# Patient Record
Sex: Female | Born: 1965
Health system: Southern US, Community
[De-identification: ages and names within clinical notes are randomized; demographics above are authoritative.]

## PROBLEM LIST (undated history)

## (undated) DIAGNOSIS — N6019 Diffuse cystic mastopathy of unspecified breast: Secondary | ICD-10-CM

## (undated) DIAGNOSIS — E785 Hyperlipidemia, unspecified: Secondary | ICD-10-CM

## (undated) DIAGNOSIS — E669 Obesity, unspecified: Secondary | ICD-10-CM

## (undated) DIAGNOSIS — E119 Type 2 diabetes mellitus without complications: Secondary | ICD-10-CM

## (undated) DIAGNOSIS — N63 Unspecified lump in unspecified breast: Secondary | ICD-10-CM

## (undated) DIAGNOSIS — E559 Vitamin D deficiency, unspecified: Secondary | ICD-10-CM

## (undated) DIAGNOSIS — H409 Unspecified glaucoma: Secondary | ICD-10-CM

## (undated) DIAGNOSIS — I10 Essential (primary) hypertension: Secondary | ICD-10-CM

## (undated) HISTORY — DX: Vitamin D deficiency, unspecified: E55.9

## (undated) HISTORY — PX: BREAST LUMPECTOMY: SHX2

## (undated) HISTORY — DX: Type 2 diabetes mellitus without complications: E11.9

## (undated) HISTORY — DX: Diffuse cystic mastopathy of unspecified breast: N60.19

## (undated) HISTORY — DX: Hyperlipidemia, unspecified: E78.5

## (undated) HISTORY — DX: Unspecified glaucoma: H40.9

## (undated) HISTORY — DX: Essential (primary) hypertension: I10

## (undated) HISTORY — DX: Unspecified lump in unspecified breast: N63.0

## (undated) HISTORY — PX: TUBAL LIGATION: SHX77

## (undated) HISTORY — DX: Obesity, unspecified: E66.9

---

## 1998-09-07 ENCOUNTER — Other Ambulatory Visit: Admission: RE | Admit: 1998-09-07 | Discharge: 1998-09-07 | Payer: Self-pay | Admitting: Obstetrics and Gynecology

## 1999-10-31 ENCOUNTER — Other Ambulatory Visit: Admission: RE | Admit: 1999-10-31 | Discharge: 1999-10-31 | Payer: Self-pay | Admitting: Obstetrics and Gynecology

## 2000-12-31 ENCOUNTER — Other Ambulatory Visit: Admission: RE | Admit: 2000-12-31 | Discharge: 2000-12-31 | Payer: Self-pay | Admitting: Obstetrics and Gynecology

## 2002-01-25 ENCOUNTER — Other Ambulatory Visit: Admission: RE | Admit: 2002-01-25 | Discharge: 2002-01-25 | Payer: Self-pay | Admitting: Obstetrics and Gynecology

## 2003-01-26 ENCOUNTER — Other Ambulatory Visit: Admission: RE | Admit: 2003-01-26 | Discharge: 2003-01-26 | Payer: Self-pay | Admitting: Obstetrics and Gynecology

## 2003-01-31 ENCOUNTER — Encounter: Payer: Self-pay | Admitting: Obstetrics and Gynecology

## 2003-01-31 ENCOUNTER — Encounter: Admission: RE | Admit: 2003-01-31 | Discharge: 2003-01-31 | Payer: Self-pay | Admitting: Obstetrics and Gynecology

## 2003-04-01 ENCOUNTER — Encounter (HOSPITAL_BASED_OUTPATIENT_CLINIC_OR_DEPARTMENT_OTHER): Payer: Self-pay | Admitting: General Surgery

## 2003-04-06 ENCOUNTER — Ambulatory Visit (HOSPITAL_COMMUNITY): Admission: RE | Admit: 2003-04-06 | Discharge: 2003-04-06 | Payer: Self-pay | Admitting: General Surgery

## 2003-04-06 ENCOUNTER — Encounter (INDEPENDENT_AMBULATORY_CARE_PROVIDER_SITE_OTHER): Payer: Self-pay | Admitting: *Deleted

## 2004-02-14 ENCOUNTER — Other Ambulatory Visit: Admission: RE | Admit: 2004-02-14 | Discharge: 2004-02-14 | Payer: Self-pay | Admitting: Obstetrics and Gynecology

## 2005-06-05 ENCOUNTER — Other Ambulatory Visit: Admission: RE | Admit: 2005-06-05 | Discharge: 2005-06-05 | Payer: Self-pay | Admitting: Obstetrics and Gynecology

## 2005-07-30 ENCOUNTER — Encounter: Admission: RE | Admit: 2005-07-30 | Discharge: 2005-07-30 | Payer: Self-pay | Admitting: General Surgery

## 2005-09-11 ENCOUNTER — Ambulatory Visit (HOSPITAL_COMMUNITY): Admission: RE | Admit: 2005-09-11 | Discharge: 2005-09-11 | Payer: Self-pay | Admitting: General Surgery

## 2005-09-11 ENCOUNTER — Ambulatory Visit (HOSPITAL_BASED_OUTPATIENT_CLINIC_OR_DEPARTMENT_OTHER): Admission: RE | Admit: 2005-09-11 | Discharge: 2005-09-11 | Payer: Self-pay | Admitting: General Surgery

## 2005-09-12 ENCOUNTER — Encounter (INDEPENDENT_AMBULATORY_CARE_PROVIDER_SITE_OTHER): Payer: Self-pay | Admitting: *Deleted

## 2006-06-10 ENCOUNTER — Other Ambulatory Visit: Admission: RE | Admit: 2006-06-10 | Discharge: 2006-06-10 | Payer: Self-pay | Admitting: Obstetrics and Gynecology

## 2006-11-05 ENCOUNTER — Encounter: Admission: RE | Admit: 2006-11-05 | Discharge: 2006-11-05 | Payer: Self-pay | Admitting: General Surgery

## 2007-04-14 ENCOUNTER — Ambulatory Visit: Payer: Self-pay | Admitting: Family Medicine

## 2007-04-14 LAB — CONVERTED CEMR LAB
Basophils Relative: 0 % (ref 0.0–1.0)
Direct LDL: 162.8 mg/dL
Eosinophils Relative: 1.6 % (ref 0.0–5.0)
HCT: 35.7 % — ABNORMAL LOW (ref 36.0–46.0)
Hemoglobin: 12 g/dL (ref 12.0–15.0)
Lymphocytes Relative: 33.8 % (ref 12.0–46.0)
MCV: 83.4 fL (ref 78.0–100.0)
Monocytes Relative: 7.7 % (ref 3.0–11.0)
RBC: 4.28 M/uL (ref 3.87–5.11)
TSH: 0.92 microintl units/mL (ref 0.35–5.50)
VLDL: 22 mg/dL (ref 0–40)

## 2007-05-14 ENCOUNTER — Ambulatory Visit: Payer: Self-pay | Admitting: Family Medicine

## 2009-04-11 ENCOUNTER — Encounter: Payer: Self-pay | Admitting: Family Medicine

## 2009-04-20 ENCOUNTER — Ambulatory Visit: Payer: Self-pay | Admitting: Family Medicine

## 2009-04-20 DIAGNOSIS — G4730 Sleep apnea, unspecified: Secondary | ICD-10-CM | POA: Insufficient documentation

## 2009-04-20 DIAGNOSIS — K219 Gastro-esophageal reflux disease without esophagitis: Secondary | ICD-10-CM

## 2009-04-20 DIAGNOSIS — J309 Allergic rhinitis, unspecified: Secondary | ICD-10-CM | POA: Insufficient documentation

## 2009-04-20 DIAGNOSIS — M25569 Pain in unspecified knee: Secondary | ICD-10-CM

## 2009-04-24 ENCOUNTER — Telehealth (INDEPENDENT_AMBULATORY_CARE_PROVIDER_SITE_OTHER): Payer: Self-pay | Admitting: *Deleted

## 2009-04-24 LAB — CONVERTED CEMR LAB
ALT: 15 units/L (ref 0–35)
AST: 18 units/L (ref 0–37)
Alkaline Phosphatase: 76 units/L (ref 39–117)
Bilirubin, Direct: 0 mg/dL (ref 0.0–0.3)
CO2: 29 meq/L (ref 19–32)
Chloride: 104 meq/L (ref 96–112)
Cholesterol: 225 mg/dL — ABNORMAL HIGH (ref 0–200)
Eosinophils Absolute: 0.1 10*3/uL (ref 0.0–0.7)
Eosinophils Relative: 1.2 % (ref 0.0–5.0)
GFR calc non Af Amer: 87.35 mL/min (ref >60)
Hemoglobin: 13 g/dL (ref 12.0–15.0)
Lymphocytes Relative: 40.8 % (ref 12.0–46.0)
MCV: 84.7 fL (ref 78.0–100.0)
Monocytes Absolute: 0.3 10*3/uL (ref 0.1–1.0)
Monocytes Relative: 6.7 % (ref 3.0–12.0)
Neutro Abs: 2.5 10*3/uL (ref 1.4–7.7)
Platelets: 213 10*3/uL (ref 150.0–400.0)
RBC: 4.49 M/uL (ref 3.87–5.11)
TSH: 0.9 microintl units/mL (ref 0.35–5.50)
Triglycerides: 106 mg/dL (ref 0.0–149.0)
WBC: 5.1 10*3/uL (ref 4.5–10.5)

## 2009-04-27 ENCOUNTER — Ambulatory Visit: Payer: Self-pay | Admitting: Sports Medicine

## 2009-04-27 DIAGNOSIS — M2140 Flat foot [pes planus] (acquired), unspecified foot: Secondary | ICD-10-CM | POA: Insufficient documentation

## 2009-04-27 DIAGNOSIS — M2170 Unequal limb length (acquired), unspecified site: Secondary | ICD-10-CM

## 2009-04-28 ENCOUNTER — Encounter: Payer: Self-pay | Admitting: Sports Medicine

## 2009-05-04 ENCOUNTER — Ambulatory Visit: Payer: Self-pay | Admitting: Pulmonary Disease

## 2009-05-04 DIAGNOSIS — G4733 Obstructive sleep apnea (adult) (pediatric): Secondary | ICD-10-CM

## 2009-05-31 ENCOUNTER — Ambulatory Visit: Payer: Self-pay | Admitting: Family Medicine

## 2009-05-31 DIAGNOSIS — E785 Hyperlipidemia, unspecified: Secondary | ICD-10-CM | POA: Insufficient documentation

## 2009-06-14 ENCOUNTER — Ambulatory Visit (HOSPITAL_BASED_OUTPATIENT_CLINIC_OR_DEPARTMENT_OTHER): Admission: RE | Admit: 2009-06-14 | Discharge: 2009-06-14 | Payer: Self-pay | Admitting: Pulmonary Disease

## 2009-06-14 ENCOUNTER — Encounter: Payer: Self-pay | Admitting: Pulmonary Disease

## 2009-06-18 ENCOUNTER — Ambulatory Visit: Payer: Self-pay | Admitting: Pulmonary Disease

## 2009-06-20 ENCOUNTER — Telehealth: Payer: Self-pay | Admitting: Pulmonary Disease

## 2009-07-10 ENCOUNTER — Ambulatory Visit: Payer: Self-pay | Admitting: Pulmonary Disease

## 2010-06-28 ENCOUNTER — Encounter: Payer: Self-pay | Admitting: Family Medicine

## 2010-06-29 ENCOUNTER — Encounter: Payer: Self-pay | Admitting: Family Medicine

## 2010-06-29 LAB — CONVERTED CEMR LAB
BUN: 11 mg/dL
CO2, serum: 26 mmol/L
Calcium: 9.6 mg/dL
Chloride, Serum: 103 mmol/L
Glucose, Bld: 103 mg/dL
Hemoglobin: 12.6 g/dL
LDL Cholesterol: 165 mg/dL
MCV: 87.2 fL
Potassium, serum: 4.5 mmol/L
RBC count: 4.46 10*6/uL
Sodium, serum: 140 mmol/L
TSH: 1.205 microintl units/mL
Total Protein: 8 g/dL
Triglycerides: 72 mg/dL

## 2010-07-18 ENCOUNTER — Encounter: Payer: Self-pay | Admitting: Family Medicine

## 2010-07-18 ENCOUNTER — Encounter: Admission: RE | Admit: 2010-07-18 | Discharge: 2010-07-18 | Payer: Self-pay | Admitting: Obstetrics and Gynecology

## 2010-07-18 IMAGING — MG MM DIGITAL SCREENING
4 series · 4 of 4 positions shown · non-contrast
Comparison: none

DG SCREEN MAMMOGRAM BILATERAL
Bilateral CC and MLO view(s) were taken.

DIGITAL SCREENING MAMMOGRAM WITH CAD:
The breast tissue is extremely dense.  No masses or malignant type calcifications are identified.  
Compared with prior studies.
Images were processed with CAD.

[R CC]
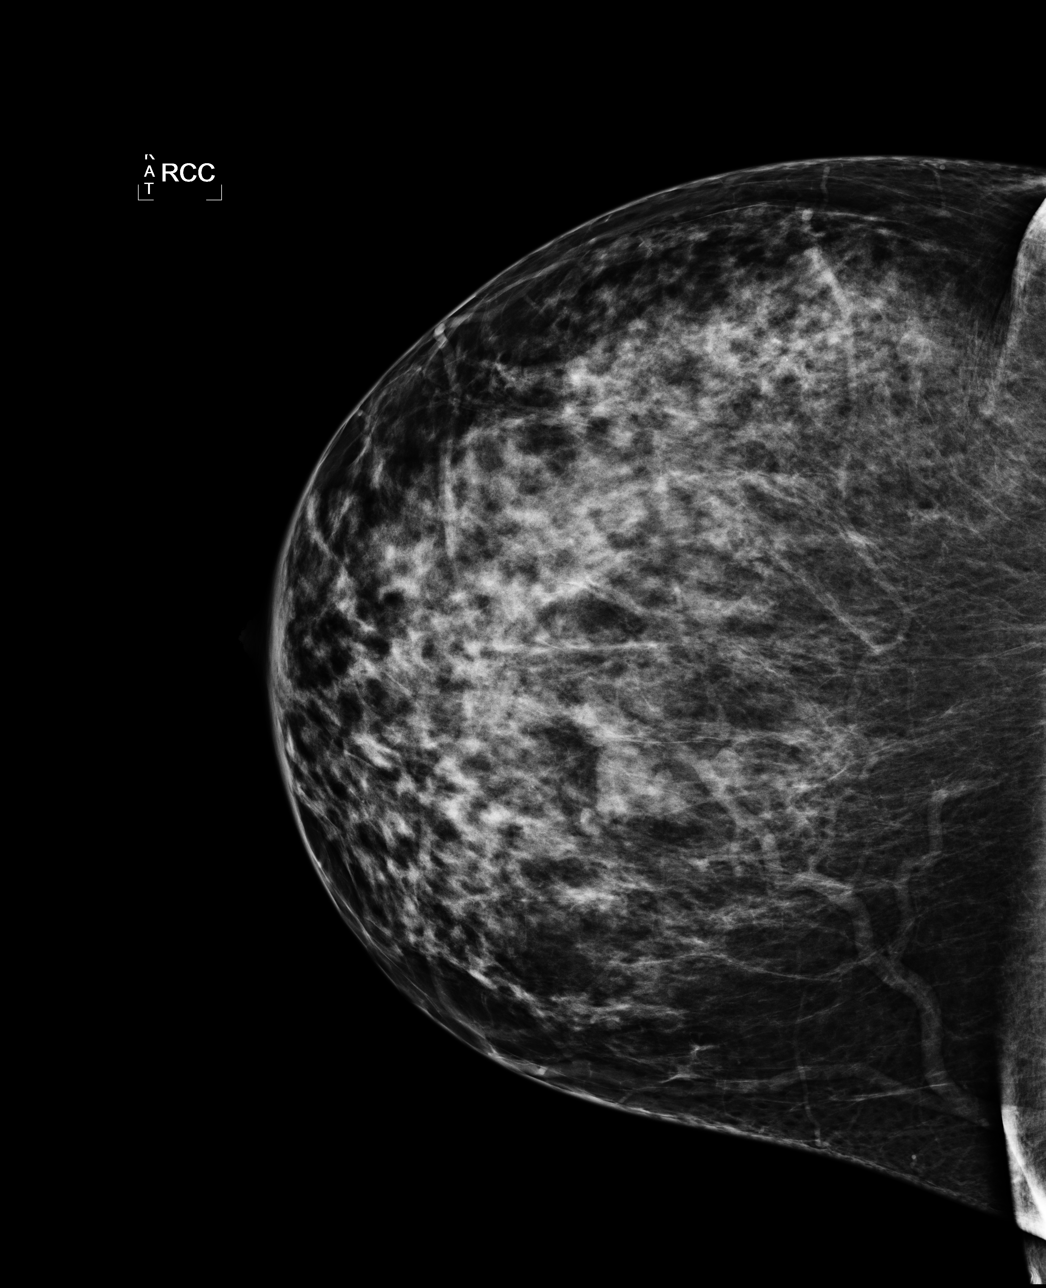

[L CC]
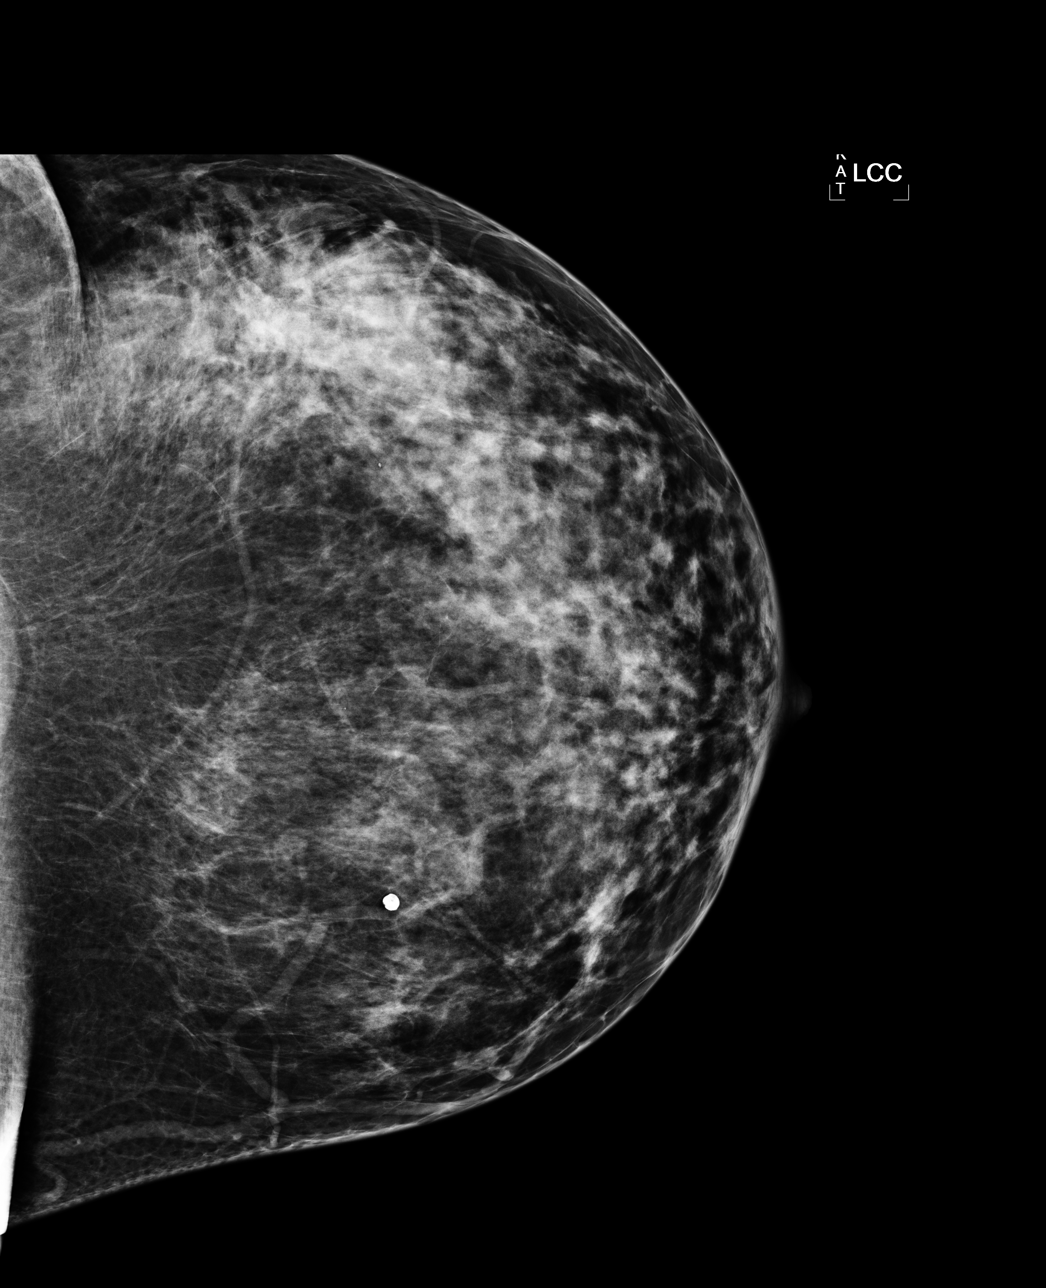

[L MLO]
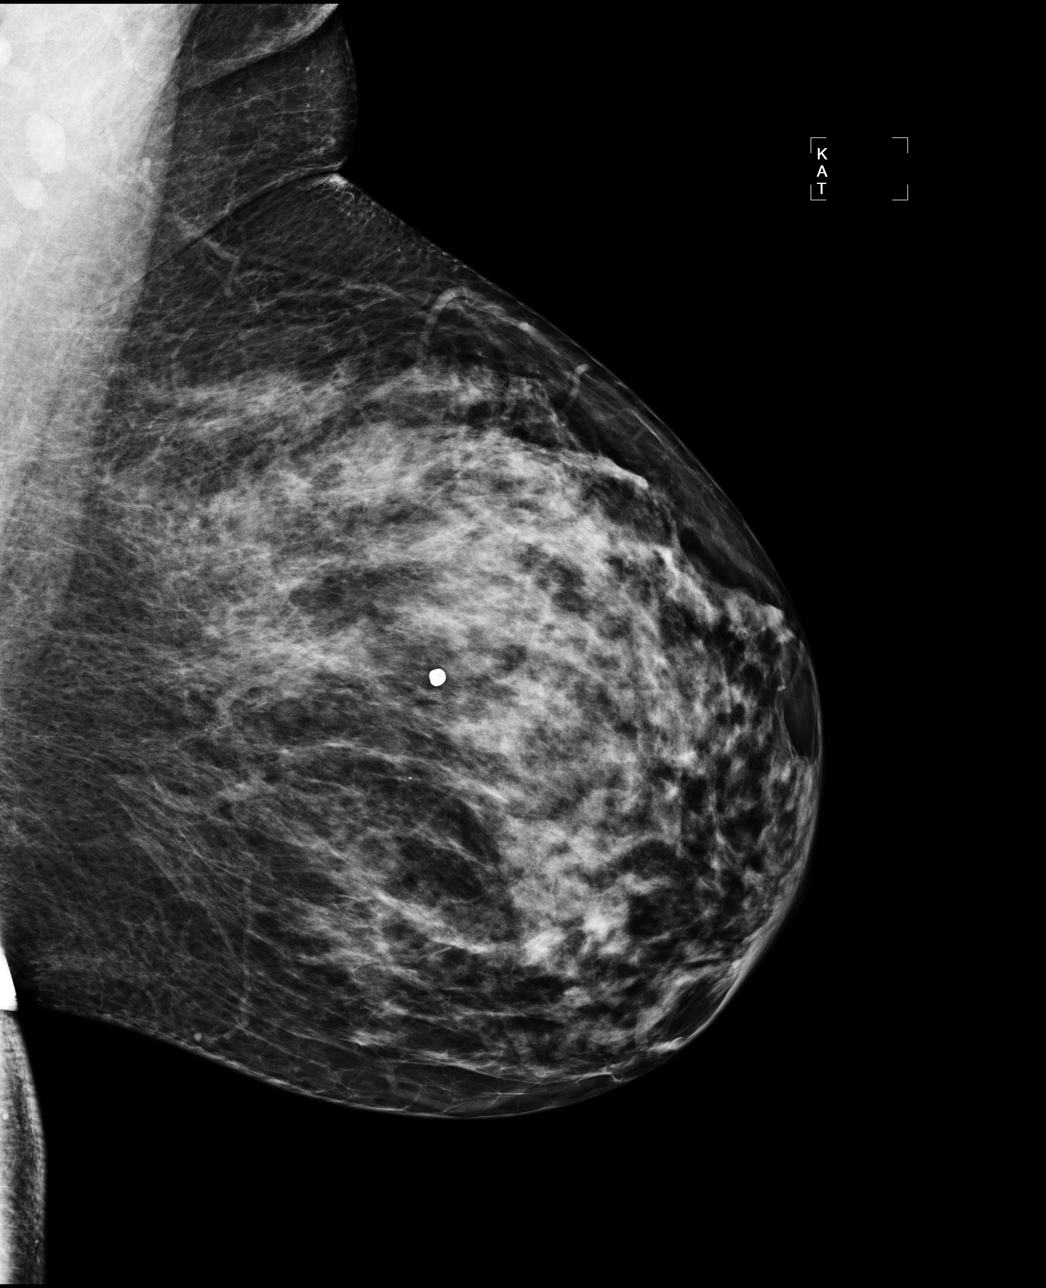

[R MLO]
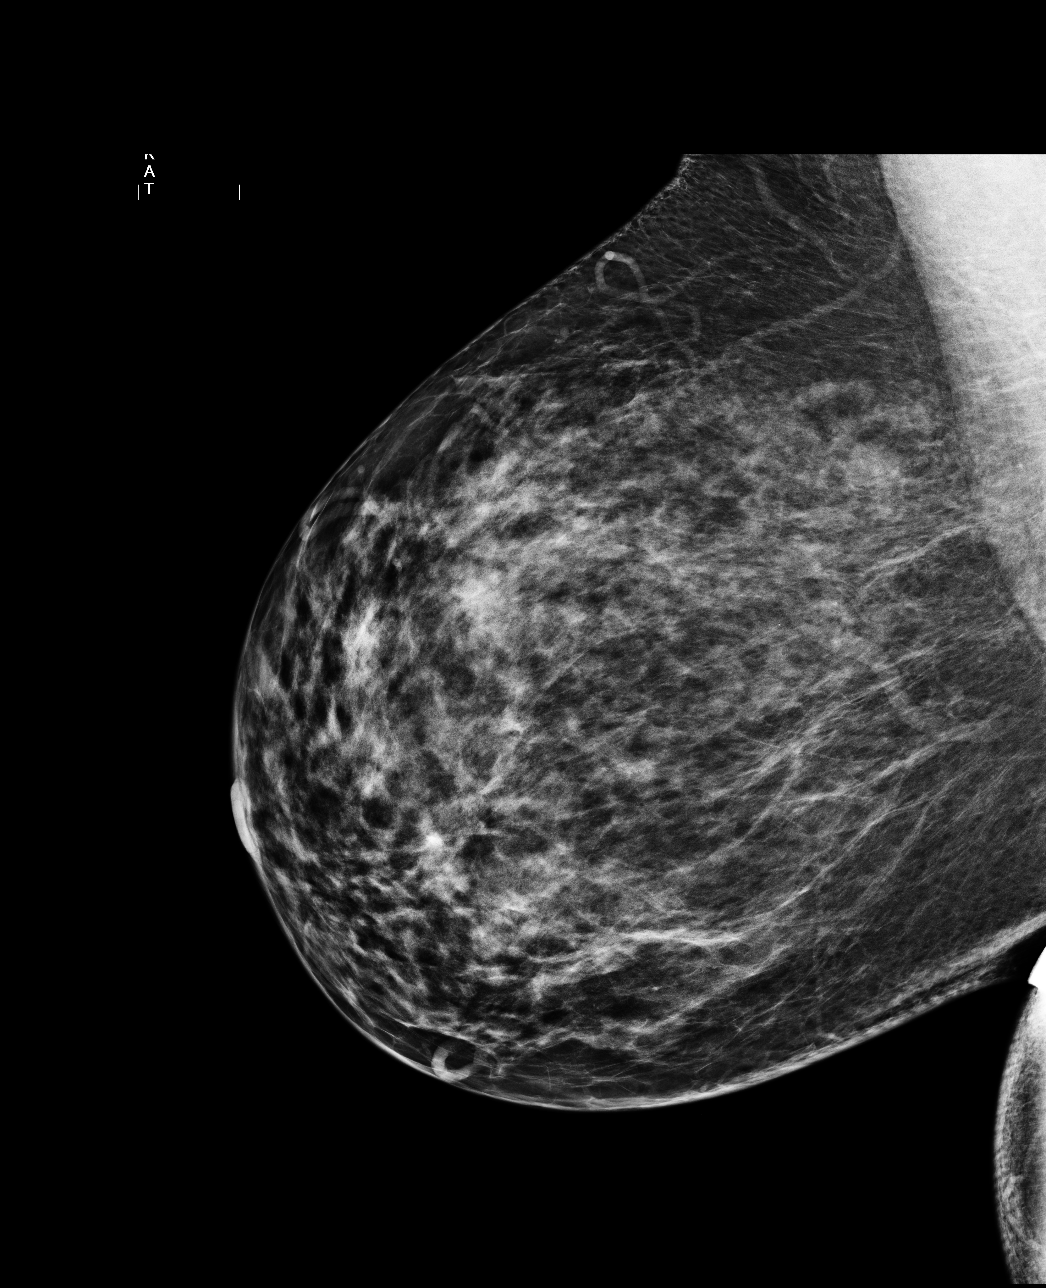

[4 of 4 positions shown; findings below may reference images not displayed]

IMPRESSION: No specific mammographic evidence of malignancy.  Next screening mammogram is recommended in one 
year.

A result letter of this screening mammogram will be mailed directly to the patient.

ASSESSMENT: Negative - BI-RADS 1

Screening mammogram in 1 year.
,

## 2010-07-27 ENCOUNTER — Ambulatory Visit: Payer: Self-pay | Admitting: Family Medicine

## 2010-08-02 ENCOUNTER — Encounter: Payer: Self-pay | Admitting: Family Medicine

## 2010-08-02 ENCOUNTER — Telehealth (INDEPENDENT_AMBULATORY_CARE_PROVIDER_SITE_OTHER): Payer: Self-pay | Admitting: *Deleted

## 2010-08-31 ENCOUNTER — Telehealth: Payer: Self-pay | Admitting: Family Medicine

## 2010-09-01 ENCOUNTER — Ambulatory Visit: Payer: Self-pay | Admitting: Family Medicine

## 2010-09-01 DIAGNOSIS — L255 Unspecified contact dermatitis due to plants, except food: Secondary | ICD-10-CM | POA: Insufficient documentation

## 2010-09-26 ENCOUNTER — Telehealth (INDEPENDENT_AMBULATORY_CARE_PROVIDER_SITE_OTHER): Payer: Self-pay | Admitting: *Deleted

## 2010-10-04 ENCOUNTER — Ambulatory Visit: Payer: Self-pay | Admitting: Family Medicine

## 2010-10-04 LAB — CONVERTED CEMR LAB
Direct LDL: 156.7 mg/dL
Total CHOL/HDL Ratio: 4
Triglycerides: 81 mg/dL (ref 0.0–149.0)

## 2010-10-05 LAB — CONVERTED CEMR LAB
ALT: 13 units/L (ref 0–35)
Albumin: 3.8 g/dL (ref 3.5–5.2)
Bilirubin, Direct: 0 mg/dL (ref 0.0–0.3)
Total Bilirubin: 0.4 mg/dL (ref 0.3–1.2)

## 2011-01-17 NOTE — Assessment & Plan Note (Signed)
Summary: cpx/fasting//kn--Rm 16   Vital Signs:  Patient profile:   45 year old female LMP:     07/03/2010 Height:      65.5 inches Weight:      215.04 pounds BMI:     35.37 Temp:     98.1 degrees F oral Pulse rate:   84 / minute Pulse rhythm:   regular Resp:     16 per minute BP sitting:   130 / 88  (left arm) Cuff size:   large  Vitals Entered By: Mervin Kung CMA Duncan Dull) (July 27, 2010 9:58 AM) CC: Room 16    Pt fasting for physical. Is Patient Diabetic? No LMP (date): 07/03/2010     Enter LMP: 07/03/2010 Last PAP Result normal    History of Present Illness: 45 yo woman here today for CPE.  GYN- Stringer. had labs done.  Started on Vit D by GYN.  no concerns about heatlh.  Preventive Screening-Counseling & Management  Alcohol-Tobacco     Alcohol drinks/day: <1     Alcohol type: wine     Smoking Status: never  Caffeine-Diet-Exercise     Caffeine use/day: tea  30-30 oz daily     Does Patient Exercise: no      Sexual History:  currently monogamous.        Drug Use:  never.    Current Medications (verified): 1)  Vitamin D 1500 Iu .... Take 1 Tablet Two Times Weekly For 8 Weeks.  Allergies (verified): No Known Drug Allergies  Past History:  reviewed meds, problems, past hx, etc in pt's other chart- no changes required  Past Surgical History: Fibroid cyst removal, left breast--2004, 2006 Tubal Ligation--1994  Family History: Mother-- living ?HTN Father-- deceased, MI 1 son--healthy 1 daughter--healthy  Social History: Occupation: Sports coach Married Never Smoked Alcohol use-yes Regular exercise-no Smoking Status:  never Caffeine use/day:  tea  30-30 oz daily Does Patient Exercise:  no Occupation:  employed Sexual History:  currently monogamous Drug Use:  never  Review of Systems  The patient denies anorexia, fever, weight loss, weight gain, vision loss, decreased hearing, hoarseness, chest pain, syncope, dyspnea on exertion,  peripheral edema, prolonged cough, headaches, abdominal pain, melena, hematochezia, severe indigestion/heartburn, hematuria, suspicious skin lesions, depression, abnormal bleeding, enlarged lymph nodes, and breast masses.    Physical Exam  General:  Well-developed,well-nourished,in no acute distress; alert,appropriate and cooperative throughout examination.  overwt Head:  Normocephalic and atraumatic without obvious abnormalities. No apparent alopecia or balding. Eyes:  No corneal or conjunctival inflammation noted. EOMI. Perrla. Funduscopic exam benign, without hemorrhages, exudates or papilledema. Vision grossly normal. Ears:  External ear exam shows no significant lesions or deformities.  Otoscopic examination reveals clear canals, tympanic membranes are intact bilaterally without bulging, retraction, inflammation or discharge. Hearing is grossly normal bilaterally. Nose:  External nasal examination shows no deformity or inflammation. Nasal mucosa are pink and moist without lesions or exudates. Mouth:  Oral mucosa and oropharynx without lesions or exudates.  Teeth in good repair. Neck:  No deformities, masses, or tenderness noted. Breasts:  deferred to gyn Lungs:  Normal respiratory effort, chest expands symmetrically. Lungs are clear to auscultation, no crackles or wheezes. Heart:  Normal rate and regular rhythm. S1 and S2 normal without gallop, murmur, click, rub or other extra sounds. Abdomen:  Bowel sounds positive,abdomen soft and non-tender without masses, organomegaly or hernias noted. Genitalia:  deferred to gyn Msk:  No deformity or scoliosis noted of thoracic or lumbar spine.   Pulses:  +  2 carotid, radial, DP Extremities:  No clubbing, cyanosis, edema, or deformity noted with normal full range of motion of all joints.   Neurologic:  No cranial nerve deficits noted. Station and gait are normal. Plantar reflexes are down-going bilaterally. DTRs are symmetrical throughout. Sensory, motor  and coordinative functions appear intact. Skin:  Intact without suspicious lesions or rashes Cervical Nodes:  No lymphadenopathy noted Axillary Nodes:  No palpable lymphadenopathy Psych:  Cognition and judgment appear intact. Alert and cooperative with normal attention span and concentration. No apparent delusions, illusions, hallucinations   Impression & Recommendations:  Problem # 1:  PHYSICAL EXAMINATION (ICD-V70.0) pt's PE WNL.  will review labs from GYN rather than repeat them.  anticipatory guidance provided.  Complete Medication List: 1)  Vitamin D 1500 Iu  .... Take 1 tablet two times weekly for 8 weeks.  Patient Instructions: 1)  Your exam looks great! 2)  Please have your GYN send me copies of your labs- if there's something missing we can always add it 3)  Try and get regular exercise and continue to make your healthy food choices 4)  Call with any questions or concerns 5)  Good luck with the new school year!   Preventive Care Screening  Pap Smear:    Date:  06/15/2009    Results:  normal      Just had mammogram 07/18/10--doesn't know result yet.   Current Allergies (reviewed today): No known allergies

## 2011-01-17 NOTE — Progress Notes (Signed)
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 Pt begin to develop small bumps on the area where plant touch her. Pt now c/o  area welting up/swelling, redness, and  some itching on area.  Pt denies any fever, pain, tenderness or sob. Pt has tried benadryl and alcohol on area with no relief. Pt has not tried any topical creams on area. Pt  schedule for saturday clinic but advise if she start to experience increased redness,swelling, difficulty breathing or fever she needs to be see in ED/UC prior to appt. Pt ok,and verbalized understanding .. pls advise on any suggestion for treatment today...................Marland KitchenFelecia Deloach CMA  August 31, 2010 4:34 PM    Follow-up for Phone Call        pt should use hydrocortisone cream on the areas.  if lip/tongue/face swelling or SOB needs to go to UC or ER overnight.  otherwise will be treated appropriately tomorrow at Saturday clinic Follow-up by: Neena Rhymes MD,  August 31, 2010 4:42 PM  Additional Follow-up for Phone Call Additional follow up Details #1::        discuss with patient, pt verbalized understanding.................Marland KitchenFelecia Deloach CMA  August 31, 2010 4:49 PM

## 2011-01-17 NOTE — Assessment & Plan Note (Signed)
Summary: rhus dermatitis   Vital Signs:  Patient profile:   45 year old female Height:      65.5 inches Weight:      216 pounds BMI:     35.53 Temp:     98.3 degrees F oral Pulse rate:   88 / minute Pulse rhythm:   regular Resp:     16 per minute BP sitting:   118 / 86  (right arm) Cuff size:   large  Vitals Entered By: Lanier Prude, CMA(AAMA) (September 01, 2010 9:33 AM) CC: itchy bumps on both arms from brushing against a plant 6 days ago Is Patient Diabetic? No   Primary Care Provider:  Beverely Low  CC:  itchy bumps on both arms from brushing against a plant 6 days ago.  History of Present Illness: 45 yo AAF presents for an itchy rash on both arms x 3 days after she was at her mom's house and brushed up against a plant on Monday.  She has had blistering on both arms with redness and pruritis.  Denies any spread.  Denies sore throat.  Denies any F/C.  She tried some topical mouthwash but it didn't help.  She also used Epsom salt.  She also tried alcholol which has helped dry it up some.  She has been taking Benadyl for itching.  She also used [employer].    Habits & Providers  Alcohol-Tobacco-Diet     Alcohol drinks/day: <1     Alcohol type: wine     Tobacco Status: never  Current Medications (verified): 1)  Fluticasone Propionate 50 Mcg/act  Susp (Fluticasone Propionate) .... 2 Sprays Each Nostril Once Daily As Needed 2)  Omeprazole 20 Mg Cpdr (Omeprazole) .... One By Mouth Daily 3)  Simvastatin 40 Mg Tabs (Simvastatin) .... Take One Tablet At Bedtime 4)  Vitamin D 1500 Iu .... Take 1 Tablet Two Times Weekly For 8 Weeks.  Allergies (verified): No Known Drug Allergies  Review of Systems      See HPI  Physical Exam  General:  alert, well-developed, well-nourished, and well-hydrated.   Eyes:  conjunctiva clear Mouth:  pharynx pink and moist.   Neck:  no masses.   Lungs:  Normal respiratory effort, chest expands symmetrically. Lungs are clear to auscultation, no  crackles or wheezes. Heart:  Normal rate and regular rhythm. S1 and S2 normal without gallop, murmur, click, rub or other extra sounds. Skin:  erythema and excoriations surrounding tiny linear vesicles on both arms.     Impression & Recommendations:  Problem # 1:  RHUS DERMATITIS (ICD-692.6) Mild.  Treat with topical steroid ointment, with benadryl at night. Call if not improved in the next 7 days.  Avoid scratching.  Clean only with soap and water. Her updated medication list for this problem includes:    Triamcinolone Acetonide 0.1 % Oint (Triamcinolone acetonide) .Marland Kitchen... Apply to rash three times a day x 7 days  Complete Medication List: 1)  Fluticasone Propionate 50 Mcg/act Susp (Fluticasone propionate) .... 2 sprays each nostril once daily as needed 2)  Omeprazole 20 Mg Cpdr (Omeprazole) .... One by mouth daily 3)  Simvastatin 40 Mg Tabs (Simvastatin) .... Take one tablet at bedtime 4)  Vitamin D 1500 Iu  .... Take 1 tablet two times weekly for 8 weeks. 5)  Triamcinolone Acetonide 0.1 % Oint (Triamcinolone acetonide) .... Apply to rash three times a day x 7 days  Patient Instructions: 1)  Use topical steroid ointment 3 x a day x 7  days.   2)  Rash should resolve by the end of treatment. 3)  Avoid scratching. 4)  use Benadyl at night. Prescriptions: TRIAMCINOLONE ACETONIDE 0.1 % OINT (TRIAMCINOLONE ACETONIDE) apply to rash three times a day x 7 days  #15 g x 0   Entered and Authorized by:   Seymour Bars DO   Signed by:   Seymour Bars DO on 09/01/2010   Method used:   Electronically to        [facility] [phone]* (retail)       [address]       [place], [place]  [address]       Ph: [phone]       Fax: [phone]   RxID:   [id number]

## 2011-01-17 NOTE — Miscellaneous (Signed)
Summary: labs from Dr.Stringer  Clinical Lists Changes  Observations: Added new observation of MAMMOGRAM: normal (07/18/2010 8:18) Added new observation of VIT D 25-OH: 13 ng/mL (06/29/2010 8:18) Added new observation of TSH: 1.205 microintl units/mL (06/29/2010 8:18) Added new observation of TRIGLYC TOT: 72 mg/dL (16/09/9603 5:40) Added new observation of LDL: 165 mg/dL (98/10/9146 8:29) Added new observation of HDL: 53 mg/dL (56/21/3086 5:78) Added new observation of CHOLESTEROL: 232 mg/dL (46/96/2952 8:41) Added new observation of BILI TOTAL: 0.3 mg/dL (32/44/0102 7:25) Added new observation of ALK PHOS: 71 units/L (06/29/2010 8:18) Added new observation of SGPT (ALT): 11 units/L (06/29/2010 8:18) Added new observation of SGOT (AST): 13 units/L (06/29/2010 8:18) Added new observation of PROTEIN, TOT: 8.0 g/dL (36/64/4034 7:42) Added new observation of ALBUMIN: 4.2 g/dL (59/56/3875 6:43) Added new observation of CALCIUM: 9.6 mg/dL (32/95/1884 1:66) Added new observation of GLUCOSE SER: 103 mg/dL (06/14/1600 0:93) Added new observation of CREATININE: 1.06 mg/dL (23/55/7322 0:25) Added new observation of BUN: 11 mg/dL (42/70/6237 6:28) Added new observation of CO2 TOTAL: 26 mmol/L (06/29/2010 8:18) Added new observation of CHLORIDE: 103 mmol/L (06/29/2010 8:18) Added new observation of POTASSIUM: 4.5 mmol/L (06/29/2010 8:18) Added new observation of SODIUM: 140 mmol/L (06/29/2010 8:18) Added new observation of PLATELETS: 219 10*3/mm3 (06/29/2010 8:18) Added new observation of MCH: 28.3 pg (06/29/2010 8:18) Added new observation of MCV: 87.2 fL (06/29/2010 8:18) Added new observation of HCT: 38.9 % (06/29/2010 8:18) Added new observation of HGB: 12.6 g/dL (31/51/7616 0:73) Added new observation of RBC: 4.46 10*6/mm3 (06/29/2010 8:18) Added new observation of WBC: 6.4 10*3/mm3 (06/29/2010 8:18)      Preventive Care Screening  Mammogram:    Date:  07/18/2010    Results:   normal  Appended Document: labs from Dr.Stringer LDL and total cholesterol are both elevated.  should start Simvastatin 40mg  at bedtime and recheck LFTs in 6-8 weeks.  needs to pay special attention to diet and exercise.  remainder of labs look good.  Appended Document: labs from Dr.Stringer see phone note

## 2011-01-17 NOTE — Progress Notes (Signed)
Summary: labs-  Phone Note Outgoing Call   Call placed by: Doristine Devoid CMA,  August 02, 2010 10:28 AM Call placed to: Patient Summary of Call: LDL and total cholesterol are both elevated.  should start Simvastatin 40mg  at bedtime and recheck LFTs in 6-8 weeks.  needs to pay special attention to diet and exercise.  remainder of labs look good  Follow-up for Phone Call        left message on machine .........Marland KitchenDoristine Devoid CMA  August 02, 2010 10:28 AM   spoke w/ patient aware of labs and says she has some of the medication at home that she hasn't taken and will start medication informed labs to be done in 6-8 weeks.........Marland KitchenDoristine Devoid CMA  August 07, 2010 2:01 PM       Appended Document: labs-    Clinical Lists Changes  Medications: Rx of SIMVASTATIN 40 MG TABS (SIMVASTATIN) take one tablet at bedtime;  #30 x 3;  Signed;  Entered by: Doristine Devoid CMA;  Authorized by: Neena Rhymes MD;  Method used: [facility] [phone]*, [address], [place], [place], [place]  [address], Ph: [phone], Fax: [phone]    Prescriptions: SIMVASTATIN 40 MG TABS (SIMVASTATIN) take one tablet at bedtime  #30 x 3   Entered by:   Doristine Devoid CMA   Authorized by:   Neena Rhymes MD   Signed by:   Doristine Devoid CMA on 08/07/2010   Method used:   Electronically to        [facility] [phone]* (retail)       [address]       [place], [place]  [address]       Ph: [phone]       Fax: [phone]   RxID:   [id number]

## 2011-01-17 NOTE — Progress Notes (Signed)
Summary: LABS ORDERS  Phone Note Call from Patient   Summary of Call: PT CALL WANTING TO GET ALL LABS THAT NEEDS TO BE DONE ON HER. BESIDES HER LFT . NEED ORDERS TO PUT ON APPT FOR 10-20 AT 8 AM. Initial call taken by: Freddy Jaksch,  September 26, 2010 10:01 AM  Follow-up for Phone Call        left message on machine ........Marland KitchenDoristine Devoid CMA  September 26, 2010 4:41 PM   spoke w/ patient informed no additional labs needed at time of appt other than vitamin d.......Marland KitchenDoristine Devoid CMA  September 26, 2010 4:45 PM

## 2011-04-02 ENCOUNTER — Ambulatory Visit (INDEPENDENT_AMBULATORY_CARE_PROVIDER_SITE_OTHER): Payer: BC Managed Care – PPO | Admitting: Family

## 2011-04-02 ENCOUNTER — Encounter: Payer: Self-pay | Admitting: Family

## 2011-04-02 VITALS — BP 130/88 | HR 96 | Temp 99.4°F | Resp 18 | Wt 219.1 lb

## 2011-04-02 DIAGNOSIS — H6690 Otitis media, unspecified, unspecified ear: Secondary | ICD-10-CM

## 2011-04-02 DIAGNOSIS — H6691 Otitis media, unspecified, right ear: Secondary | ICD-10-CM | POA: Insufficient documentation

## 2011-04-02 MED ORDER — AMOXICILLIN 875 MG PO TABS: 875.0000 mg | ORAL_TABLET | Freq: Two times a day (BID) | ORAL | Status: AC

## 2011-04-02 NOTE — Progress Notes (Signed)
  Subjective:    Patient ID: Nancy Spears, female    DOB: [birth year], 45 y.o.   MRN: [id number]  HPI  Sinus drainage- stared Sunday,  Throat has been scratchy.  Feels hoarse.  Clear drainage.  Denies fever.  +cough but it is dry and associated with drainage.  She took claritin yesterday with mild improvement in her symptoms. She nots some frontal sinus pressure.    Review of Systems See HPI  No past medical history on file.  History   Social History  . Marital Status: Married    Spouse Name: N/A    Number of Children: N/A  . Years of Education: N/A   Occupational History  . Not on file.   Social History Main Topics  . Smoking status: Never Smoker   . Smokeless tobacco: Never Used  . Alcohol Use: Not on file  . Drug Use: Not on file  . Sexually Active: Not on file   Other Topics Concern  . Not on file   Social History Narrative  . No narrative on file    No past surgical history on file.  No family history on file.  No Known Allergies  No current outpatient prescriptions on file prior to visit.    BP 130/88  Pulse 96  Temp(Src) 99.4 F (37.4 C) (Oral)  Resp 18  Wt 219 lb 1.3 oz (99.374 kg)       Objective:   Physical Exam  Constitutional: She appears well-developed and well-nourished.  HENT:  Right Ear: Ear canal normal. Tympanic membrane is erythematous and bulging.  Left Ear: Ear canal normal. Tympanic membrane is not erythematous and not bulging.  Neck: Normal range of motion. Neck supple.  Cardiovascular: Normal rate and regular rhythm.   Pulmonary/Chest: Effort normal and breath sounds normal.  Lymphadenopathy:    She has no cervical adenopathy.          Assessment & Plan:

## 2011-04-02 NOTE — Assessment & Plan Note (Signed)
45 yr old female with low grade temp and congestion.  Exam is consistent with R OM.  Will treat with amoxicillin.  Continue claritin.

## 2011-04-02 NOTE — Patient Instructions (Signed)
Call if your symptoms worsen, or if they are not improved in 48-72 hours. You may take motrin as needed for pain.

## 2011-04-30 NOTE — Procedures (Signed)
NAMEAMALIA, Nancy Spears NO.:  [id number]   MEDICAL RECORD NO.:  [id number]          PATIENT TYPE:  OUT   LOCATION:  SLEEP CENTER                 FACILITY:  [facility]   PHYSICIAN:  Barbaraann Share, MD,FCCPDATE OF BIRTH:  07-Oct-1965   DATE OF STUDY:  06/14/2009                            NOCTURNAL POLYSOMNOGRAM   REFERRING PHYSICIAN:   LOCATION:  Sleep lab.   REFERRING PHYSICIAN:  Barbaraann Share, MD, FCCP   INDICATIONS FOR THE STUDY:  Hypersomnia with sleep apnea.   EPWORTH SCORE:  7.   SLEEP ARCHITECTURE:  The patient had total sleep time of 347 minutes  with no slow wave sleep and only 67 minutes of REM.  Sleep onset latency  was prolonged at 67 minutes and REM onset was normal at 72 minutes.  Sleep efficiency was decreased at 82%.   RESPIRATORY DATA:  The patient was found to have 10 apneas and 47  obstructive hypopneas for an AHI of 10 events per hour.  Events were not  positional, but they were almost totally related to REM.  Moderate-to-  loud snoring was noted throughout.   OXYGEN DATA:  There was O2 desaturation as low as 74% with her  obstructive events.   CARDIAC DATA:  No clinically significant arrhythmias were seen.   MOVEMENT/PARASOMNIA:  The patient had no significant leg jerks noted,  but there was increased EMG activity that was somewhat suggestive of  bruxism at various times during the study.   IMPRESSION/RECOMMENDATIONS:  1. Mild obstructive sleep apnea/hypopnea syndrome with an AHI of 10      events per hour and O2 desaturation transiently as low as 74%.      Treatment for this degree of sleep apnea can include weight loss      alone if      applicable, upper airway surgery, oral appliance, and also CPAP.      Clinical correlation is suggested.  2. Intermittent increase in EMG activity, which was suggestive of      bruxism.  Clinical correlation is suggested.      Barbaraann Share, MD,FCCP  Diplomate, American Board of Sleep   Medicine  Electronically Signed     KMC/MEDQ  D:  06/18/2009 15:13:49  T:  06/19/2009 03:29:46  Job:  [id number]

## 2011-05-03 NOTE — Op Note (Signed)
NAMEMERCIA, DOWE NO.:  [id number]   MEDICAL RECORD NO.:  [id number]          PATIENT TYPE:  AMB   LOCATION:  DSC                          FACILITY:  [facility]   PHYSICIAN:  Leonie Man, M.D.   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   POSTOPERATIVE DIAGNOSIS:  Rule out recurrent phylloides tumor of left  breast, pathology pending.   OPERATION PERFORMED:  Wide local excision of left breast mass.   SURGEON:  Leonie Man, M.D.   ANESTHESIA:  General.   INDICATIONS FOR PROCEDURE:  The patient is a 45 year old female who had a  benign phylloides tumor excised some years ago.  She presents now with an  even larger mass in the left breast measuring approximately 5 x 5 x 4 cm in  size.  There is no associated axillary lymphadenopathy and no palpable  satellite nodules on imaging studies and on palpation.  She comes to the  operating room now after the risks and potential benefits of surgery have  been discussed, all questions answered and consent obtained.  She is aware  that if indeed this is a phylloides tumor, she may need to eventually come  to mastectomy.   DESCRIPTION OF PROCEDURE:  Following the induction of satisfactory general  anesthesia, the patient was positioned supinely and the left breast was  prepped and draped to be included in a sterile operative field.  The mass  which was located in the upper outer quadrant of left breast was approached  through a transverse incision, deepened through the skin and down to the  subcutaneous tissue tissues.  I raised flaps superiorly, inferiorly,  laterally and medially so as to get a wide margin around the mass.  The  margins carried down to the chest wall on the deep side.  The entire mass  and surrounding breast tissue was removed and  forwarded for pathologic  evaluation.  Hemostasis was then assured with electrocautery.  The breast  tissues reapproximated with 2-0 Vicryl sutures.  Subcutaneous tissues were  closed with 3-0 Vicryl sutures and the skin closed with a running 4-0  Monocryl suture.  The wound was then reinforced with Steri-Strips.  Sterile  dressings were applied.  Anesthetic was reversed and the patient removed  from the operating room to the recovery room in stable condition.  She  tolerated the procedure well.      Leonie Man, M.D.  Electronically Signed     PB/MEDQ  D:  09/11/2005  T:  09/12/2005  Job:  [id number]

## 2011-05-03 NOTE — Op Note (Signed)
   [name], Nancy Spears                           ACCOUNT NO.:  [id number]   MEDICAL RECORD NO.:  [id number]                   PATIENT TYPE:  OIB   LOCATION:  2899                                 FACILITY:  [facility]   PHYSICIAN:  Leonie Man, M.D.                DATE OF BIRTH:  Mar 08, 1966   DATE OF PROCEDURE:  04/06/2003  DATE OF DISCHARGE:  04/06/2003                                 OPERATIVE REPORT   PREOPERATIVE DIAGNOSIS:  Fibroadenoma, left breast.   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   PROCEDURE:  Excisional biopsy of left breast mass.   SURGEON:  Mardene Celeste. Lurene Shadow, M.D.   ASSISTANT:  Nurse   ANESTHESIA:  General.   This patient is a 45 year old woman presenting with a large, smooth mass of  the left breast with no significant increase in the size since discovery.  She comes to the operating room for excision of this mass.   PROCEDURE:  Following the induction of satisfactory general anesthesia, the  patient was positioned supine.  The left breast was prepped and draped to be  included in the sterile operative field.  A circumareolar incision was  carried down through the skin and subcutaneous tissue with a superior flap  raised and carrying the dissection up to the upper outer quadrant of the  left breast where the mass was encountered.  The mass was sharply dissected  free with a rim of normal breast tissue around it.  It was removed in its  entirety for the pathologic evaluation.  Hemostasis was assured with  electrocautery.  Sponge, instruments, and sharp counts were verified.  The  breast tissues were reapproximated with 2-0 Vicryl suture, the subcutaneous  tissue were closed with 3-0 Vicryl sutures, and the skin was closed with  running 5-0 Monocryl suture reinforced with Steri-Strips.  A sterile  compressive dressing was applied.  The anesthetic was reversed and the  patient removed from the operating room to the recovery room in  stable  condition, having tolerated the procedure well.                                               Leonie Man, M.D.    PB/MEDQ  D:  06/29/2003  T:  06/29/2003  Job:  [id number]

## 2011-07-16 ENCOUNTER — Other Ambulatory Visit: Payer: Self-pay | Admitting: Obstetrics and Gynecology

## 2011-07-16 DIAGNOSIS — Z1231 Encounter for screening mammogram for malignant neoplasm of breast: Secondary | ICD-10-CM

## 2011-07-24 ENCOUNTER — Ambulatory Visit
Admission: RE | Admit: 2011-07-24 | Discharge: 2011-07-24 | Disposition: A | Discharge status: Home / Self Care | Payer: BC Managed Care – PPO | Source: Ambulatory Visit | Attending: Obstetrics and Gynecology | Admitting: Obstetrics and Gynecology

## 2011-07-24 DIAGNOSIS — Z1231 Encounter for screening mammogram for malignant neoplasm of breast: Secondary | ICD-10-CM

## 2012-05-06 ENCOUNTER — Encounter: Payer: Self-pay | Admitting: Family Medicine

## 2012-05-06 ENCOUNTER — Ambulatory Visit (INDEPENDENT_AMBULATORY_CARE_PROVIDER_SITE_OTHER): Payer: BC Managed Care – PPO | Admitting: Family Medicine

## 2012-05-06 VITALS — BP 127/84 | HR 74 | Temp 98.3°F | Ht 66.0 in | Wt 221.2 lb

## 2012-05-06 DIAGNOSIS — J309 Allergic rhinitis, unspecified: Secondary | ICD-10-CM

## 2012-05-06 DIAGNOSIS — J302 Other seasonal allergic rhinitis: Secondary | ICD-10-CM

## 2012-05-06 NOTE — Patient Instructions (Signed)
This is all due to allergy/sinus congestion Restart the Claritin or Zyrtec daily Add the Nasonex- 2 sprays each nostril daily Start nasal decongestant (sudafed or similar) for 3 days Drink plenty of fluids REST! Hang in there!!!

## 2012-05-06 NOTE — Assessment & Plan Note (Signed)
Deteriorated.  Pt's sxs and PE consistent w/ seasonal allergies- no evidence of infxn.  Restart OTC antihistamine, nasal steroid spray, and add OTC decongestant.  Reviewed supportive care and red flags that should prompt return.  Pt expressed understanding and is in agreement w/ plan.

## 2012-05-06 NOTE — Progress Notes (Signed)
  Subjective:    Patient ID: Nancy Spears, female    DOB: [birth year], 46 y.o.   MRN: [id number]  HPI Sinus congestion- sxs started 2 weeks ago, husband has sinus infxn.  Taking Mucinex DM w/out relief.  Bilateral ear fullness, PND, dry cough, sore throat, nasal congestion, + facial pain/pressure, HA.  No fevers.   Review of Systems For ROS see HPI     Objective:   Physical Exam  Vitals reviewed. Constitutional: She appears well-developed and well-nourished. No distress.  HENT:  Head: Normocephalic and atraumatic.  Right Ear: Tympanic membrane normal.  Left Ear: Tympanic membrane normal.  Nose: Mucosal edema and rhinorrhea present. Right sinus exhibits no maxillary sinus tenderness and no frontal sinus tenderness. Left sinus exhibits no maxillary sinus tenderness and no frontal sinus tenderness.  Mouth/Throat: Mucous membranes are normal. Posterior oropharyngeal erythema (w/ PND) present.  Eyes: Conjunctivae and EOM are normal. Pupils are equal, round, and reactive to light.  Neck: Normal range of motion. Neck supple.  Cardiovascular: Normal rate, regular rhythm and normal heart sounds.   Pulmonary/Chest: Effort normal and breath sounds normal. No respiratory distress. She has no wheezes. She has no rales.  Lymphadenopathy:    She has no cervical adenopathy.          Assessment & Plan:

## 2012-09-17 ENCOUNTER — Ambulatory Visit (INDEPENDENT_AMBULATORY_CARE_PROVIDER_SITE_OTHER): Payer: BC Managed Care – PPO | Admitting: Obstetrics and Gynecology

## 2012-09-17 ENCOUNTER — Encounter: Payer: Self-pay | Admitting: Obstetrics and Gynecology

## 2012-09-17 VITALS — BP 122/62 | Ht 64.0 in | Wt 224.0 lb

## 2012-09-17 DIAGNOSIS — Z01419 Encounter for gynecological examination (general) (routine) without abnormal findings: Secondary | ICD-10-CM

## 2012-09-17 DIAGNOSIS — Z124 Encounter for screening for malignant neoplasm of cervix: Secondary | ICD-10-CM

## 2012-09-17 NOTE — Progress Notes (Signed)
Subjective:    Nancy Spears is a 46 y.o. female No obstetric history on file. who presents for annual exam. The patient complains of no cycle for several months. She has occasional hot flashes.  The patient has a history of elevated cholesterol.  Her family physician is helping her with her nutrition.  The following portions of the patient's history were reviewed and updated as appropriate: allergies, current medications, past family history, past medical history, past social history, past surgical history and problem list.  Review of Systems Pertinent items are noted in HPI. Gastrointestinal:No change in bowel habits, no abdominal pain, no rectal bleeding Genitourinary:negative for dysuria, frequency, hematuria, nocturia and urinary incontinence    Objective:     BP 122/62  Ht 5\' 4"  (1.626 m)  Wt 224 lb (101.606 kg)  BMI 38.45 kg/m2  LMP 05/27/2012  Weight:  Wt Readings from Last 1 Encounters:  09/17/12 224 lb (101.606 kg)     BMI: Body mass index is 38.45 kg/(m^2). General Appearance: Alert, appropriate appearance for age. No acute distress HEENT: Grossly normal Neck / Thyroid: Supple, no masses, nodes or enlargement Lungs: clear to auscultation bilaterally Back: No CVA tenderness Breast Exam: No masses or nodes.No dimpling, nipple retraction or discharge. Cardiovascular: Regular rate and rhythm. S1, S2, no murmur Gastrointestinal: Soft, non-tender, no masses or organomegaly  ++++++++++++++++++++++++++++++++++++++++++++++++++++++++  Pelvic Exam: External genitalia: normal general appearance Vaginal: normal without tenderness, induration or masses and relaxation noted Cervix: normal appearance Adnexa: normal bimanual exam Uterus: upper limits normal size Rectovaginal: normal rectal, no masses  ++++++++++++++++++++++++++++++++++++++++++++++++++++++++  Lymphatic Exam: Non-palpable nodes in neck, clavicular, axillary, or inguinal regions  Psychiatric: Alert and oriented,  appropriate affect.@OBJECTIVEEN @      Assessment:    Normal gyn exam   Overweight or obese: Yes  Pelvic relaxation: Yes  Menopausal symptoms: Yes. Severe: No.  Menopausal symptoms  Hyperlipidemia   Plan:    Mammogram.   Follow-up:  for annual exam  followup with family physician about weight loss and elevated cholesterol.  The updated Pap smear screening guidelines were discussed with the patient. The patient requested that I obtain a Pap smear: No. Next Pap in 2015.  Kegel exercises discussed: Yes.  Proper diet and regular exercise were reviewed.  Annual mammograms recommended starting at age 7. Proper breast care was discussed.  Screening colonoscopy is recommended beginning at age 60.  Regular health maintenance was reviewed.  Sleep hygiene was discussed.  Adequate calcium and vitamin D intake was emphasized.  Leonard Schwartz M.D.   Regular Periods: no no cycle since June Mammogram: yes  Monthly Breast Ex.: yes Exercise: yes  Tetanus < 10 years: yes Seatbelts: yes  NI. Bladder Functn.: yes Abuse at home: no  Daily BM's: yes Stressful Work: no  Healthy Diet: yes Sigmoid-Colonoscopy: n/a  Calcium: no Medical problems this year: pt concerned about not having cycle since June   LAST PAP: 06/27/2009  Contraception: BTL  Mammogram:  07/2011  PCP: Dr. Nathaniel Man  PMH: None  FMH: None  Last Bone Scan: N/A

## 2012-10-07 ENCOUNTER — Other Ambulatory Visit: Payer: Self-pay | Admitting: Family Medicine

## 2012-10-07 DIAGNOSIS — Z1231 Encounter for screening mammogram for malignant neoplasm of breast: Secondary | ICD-10-CM

## 2012-10-15 ENCOUNTER — Ambulatory Visit
Admission: RE | Admit: 2012-10-15 | Discharge: 2012-10-15 | Disposition: A | Discharge status: Home / Self Care | Payer: BC Managed Care – PPO | Source: Ambulatory Visit | Attending: Family Medicine | Admitting: Family Medicine

## 2012-10-15 DIAGNOSIS — Z1231 Encounter for screening mammogram for malignant neoplasm of breast: Secondary | ICD-10-CM

## 2012-12-03 ENCOUNTER — Encounter: Payer: Self-pay | Admitting: Family Medicine

## 2012-12-03 ENCOUNTER — Ambulatory Visit (INDEPENDENT_AMBULATORY_CARE_PROVIDER_SITE_OTHER): Payer: BC Managed Care – PPO | Admitting: Family Medicine

## 2012-12-03 VITALS — BP 120/70 | HR 83 | Temp 98.7°F | Ht 64.25 in | Wt 229.6 lb

## 2012-12-03 DIAGNOSIS — Z23 Encounter for immunization: Secondary | ICD-10-CM

## 2012-12-03 DIAGNOSIS — E785 Hyperlipidemia, unspecified: Secondary | ICD-10-CM

## 2012-12-03 DIAGNOSIS — Z0000 Encounter for general adult medical examination without abnormal findings: Secondary | ICD-10-CM

## 2012-12-03 NOTE — Progress Notes (Signed)
  Subjective:    Patient ID: Nancy Spears, female    DOB: 04-01-1966, 46 y.o.   MRN: [id number]  HPI CPE- UTD on mammo, pap (Dr Stefano Gaul).   Review of Systems Patient reports no vision/ hearing changes, adenopathy,fever, weight change,  persistant/recurrent hoarseness , swallowing issues, chest pain, palpitations, edema, persistant/recurrent cough, hemoptysis, dyspnea (rest/exertional/paroxysmal nocturnal), gastrointestinal bleeding (melena, rectal bleeding), abdominal pain, significant heartburn, bowel changes, GU symptoms (dysuria, hematuria, incontinence), Gyn symptoms (abnormal  bleeding, pain),  syncope, focal weakness, memory loss, numbness & tingling, skin/hair/nail changes, abnormal bruising or bleeding, anxiety, or depression.     Objective:   Physical Exam General Appearance:    Alert, cooperative, no distress, appears stated age  Head:    Normocephalic, without obvious abnormality, atraumatic  Eyes:    PERRL, conjunctiva/corneas clear, EOM's intact, fundi    benign, both eyes  Ears:    Normal TM's and external ear canals, both ears  Nose:   Nares normal, septum midline, mucosa normal, no drainage    or sinus tenderness  Throat:   Lips, mucosa, and tongue normal; teeth and gums normal  Neck:   Supple, symmetrical, trachea midline, no adenopathy;    Thyroid: no enlargement/tenderness/nodules  Back:     Symmetric, no curvature, ROM normal, no CVA tenderness  Lungs:     Clear to auscultation bilaterally, respirations unlabored  Chest Wall:    No tenderness or deformity   Heart:    Regular rate and rhythm, S1 and S2 normal, no murmur, rub   or gallop  Breast Exam:    Deferred to GYN  Abdomen:     Soft, non-tender, bowel sounds active all four quadrants,    no masses, no organomegaly  Genitalia:    Deferred to GYN  Rectal:    Extremities:   Extremities normal, atraumatic, no cyanosis or edema  Pulses:   2+ and symmetric all extremities  Skin:   Skin color, texture, turgor  normal, no rashes or lesions  Lymph nodes:   Cervical, supraclavicular, and axillary nodes normal  Neurologic:   CNII-XII intact, normal strength, sensation and reflexes    throughout          Assessment & Plan:

## 2012-12-03 NOTE — Patient Instructions (Addendum)
Follow up in 6 months to recheck cholesterol Try and get regular exercise and make healthy food choices We'll notify you of your lab results and make any changes if needed Call with any questions or concerns Happy New Year!!!

## 2012-12-04 LAB — BASIC METABOLIC PANEL
CO2: 29 mEq/L (ref 19–32)
Chloride: 102 mEq/L (ref 96–112)
Creatinine, Ser: 1 mg/dL (ref 0.4–1.2)
Glucose, Bld: 83 mg/dL (ref 70–99)
Potassium: 3.9 mEq/L (ref 3.5–5.1)
Sodium: 138 mEq/L (ref 135–145)

## 2012-12-04 LAB — LIPID PANEL
Cholesterol: 205 mg/dL — ABNORMAL HIGH (ref 0–200)
HDL: 48.3 mg/dL (ref >39.00)
Triglycerides: 106 mg/dL (ref 0.0–149.0)

## 2012-12-04 LAB — HEPATIC FUNCTION PANEL
Albumin: 3.7 g/dL (ref 3.5–5.2)
Bilirubin, Direct: 0 mg/dL (ref 0.0–0.3)

## 2012-12-04 LAB — CBC WITH DIFFERENTIAL/PLATELET
Basophils Absolute: 0 10*3/uL (ref 0.0–0.1)
Basophils Relative: 0.5 % (ref 0.0–3.0)
HCT: 36.3 % (ref 36.0–46.0)
Hemoglobin: 11.9 g/dL — ABNORMAL LOW (ref 12.0–15.0)
Lymphocytes Relative: 38.4 % (ref 12.0–46.0)
Lymphs Abs: 2.7 10*3/uL (ref 0.7–4.0)
MCHC: 32.7 g/dL (ref 30.0–36.0)
Monocytes Absolute: 0.7 10*3/uL (ref 0.1–1.0)
Neutro Abs: 3.5 10*3/uL (ref 1.4–7.7)
Platelets: 224 10*3/uL (ref 150.0–400.0)

## 2012-12-04 LAB — LDL CHOLESTEROL, DIRECT: Direct LDL: 152.8 mg/dL

## 2012-12-06 LAB — VITAMIN D 1,25 DIHYDROXY: Vitamin D3 1, 25 (OH)2: 40 pg/mL

## 2012-12-06 NOTE — Assessment & Plan Note (Addendum)
Chronic problem, not currently on meds.  Attempting to control w/ diet and exercise.  Check labs.  Start meds prn.

## 2012-12-06 NOTE — Assessment & Plan Note (Signed)
Pt's PE WNL w/ exception of weight.  UTD on GYN.  Check labs.  Anticipatory guidance provided.  

## 2013-05-11 ENCOUNTER — Ambulatory Visit: Payer: BC Managed Care – PPO | Admitting: Family Medicine

## 2013-05-13 ENCOUNTER — Encounter: Payer: Self-pay | Admitting: Family Medicine

## 2013-05-13 ENCOUNTER — Ambulatory Visit (INDEPENDENT_AMBULATORY_CARE_PROVIDER_SITE_OTHER): Payer: BC Managed Care – PPO | Admitting: Family Medicine

## 2013-05-13 VITALS — BP 128/98 | HR 87 | Temp 98.3°F | Ht 64.5 in | Wt 227.8 lb

## 2013-05-13 DIAGNOSIS — E785 Hyperlipidemia, unspecified: Secondary | ICD-10-CM

## 2013-05-13 DIAGNOSIS — R609 Edema, unspecified: Secondary | ICD-10-CM

## 2013-05-13 DIAGNOSIS — R030 Elevated blood-pressure reading, without diagnosis of hypertension: Secondary | ICD-10-CM

## 2013-05-13 DIAGNOSIS — I10 Essential (primary) hypertension: Secondary | ICD-10-CM | POA: Insufficient documentation

## 2013-05-13 LAB — HEPATIC FUNCTION PANEL
Total Bilirubin: 0.3 mg/dL (ref 0.3–1.2)
Total Protein: 8.1 g/dL (ref 6.0–8.3)

## 2013-05-13 LAB — LIPID PANEL
Cholesterol: 196 mg/dL (ref 0–200)
HDL: 48.2 mg/dL (ref >39.00)
LDL Cholesterol: 132 mg/dL — ABNORMAL HIGH (ref 0–99)
Triglycerides: 79 mg/dL (ref 0.0–149.0)

## 2013-05-13 LAB — BASIC METABOLIC PANEL
Creatinine, Ser: 1 mg/dL (ref 0.4–1.2)
Glucose, Bld: 98 mg/dL (ref 70–99)
Sodium: 138 mEq/L (ref 135–145)

## 2013-05-13 NOTE — Patient Instructions (Addendum)
Schedule an appt in 4-6 weeks to recheck BP Try and limit your salt intake, increase your water intake, and get regular exercise- this will improve both the BP and the swelling We'll notify you of your lab results and make any changes if needed Call with any questions or concerns Hang in there!

## 2013-05-13 NOTE — Progress Notes (Signed)
  Subjective:    Patient ID: Nancy Spears, female    DOB: 17-Jan-1966, 47 y.o.   MRN: [id number]  HPI Hyperlipidemia- found at last visit.  Started walking routine but this has been infrequent recently due to work.  Not currently on meds.  Not following particular diet.  Elevated BP- new for pt.  Pt reports increased work stress.  No CP, SOB, HAs, visual changes.  + edema.  sxs started 3 weeks ago.  sxs improve overnight.  Will worsen as day progresses.  Bilateral sxs, L>R.  Not following low salt diet   Review of Systems For ROS see HPI     Objective:   Physical Exam  Vitals reviewed. Constitutional: She is oriented to person, place, and time. She appears well-developed and well-nourished. No distress.  HENT:  Head: Normocephalic and atraumatic.  Eyes: Conjunctivae and EOM are normal. Pupils are equal, round, and reactive to light.  Neck: Normal range of motion. Neck supple. No thyromegaly present.  Cardiovascular: Normal rate, regular rhythm, normal heart sounds and intact distal pulses.   No murmur heard. Pulmonary/Chest: Effort normal and breath sounds normal. No respiratory distress.  Abdominal: Soft. She exhibits no distension. There is no tenderness.  Musculoskeletal: She exhibits edema (trace edema of L foot, no edema on R).  Lymphadenopathy:    She has no cervical adenopathy.  Neurological: She is alert and oriented to person, place, and time.  Skin: Skin is warm and dry.  Psychiatric: She has a normal mood and affect. Her behavior is normal.          Assessment & Plan:

## 2013-05-15 DIAGNOSIS — R609 Edema, unspecified: Secondary | ICD-10-CM | POA: Insufficient documentation

## 2013-05-15 NOTE — Assessment & Plan Note (Signed)
New.  Suspect this is due to increased stress recently, high sodium diet, and pt's weight.  Encouraged low sodium diet, regular exercise.  Will follow.  May need to start HCTZ at next visit if BP doesn't improve.  Pt expressed understanding and is in agreement w/ plan.

## 2013-05-15 NOTE — Assessment & Plan Note (Signed)
Ongoing problem.  Attempting to control w/ healthy diet and regular exercise.  Pt admits to being lax in both.  Get labs.  Start meds prn.

## 2013-05-15 NOTE — Assessment & Plan Note (Signed)
New.  Suspect this is multifactorial- pt's weight, increased heat, amount of Na in diet and limited water intake.  No evidence of CHF or liver failure on PE.  Reassuring that swelling improves w/ elevation.  Will follow.

## 2013-06-14 ENCOUNTER — Ambulatory Visit (INDEPENDENT_AMBULATORY_CARE_PROVIDER_SITE_OTHER): Payer: BC Managed Care – PPO | Admitting: Family Medicine

## 2013-06-14 ENCOUNTER — Encounter: Payer: Self-pay | Admitting: Family Medicine

## 2013-06-14 VITALS — BP 138/90 | HR 78 | Temp 98.6°F | Ht 64.5 in | Wt 229.8 lb

## 2013-06-14 DIAGNOSIS — R030 Elevated blood-pressure reading, without diagnosis of hypertension: Secondary | ICD-10-CM

## 2013-06-14 MED ORDER — HYDROCHLOROTHIAZIDE 12.5 MG PO TABS: 12.5000 mg | ORAL_TABLET | Freq: Every day | ORAL | Status: DC

## 2013-06-14 NOTE — Progress Notes (Signed)
  Subjective:    Patient ID: Nancy Spears, female    DOB: April 05, 1966, 47 y.o.   MRN: [id number]  HPI HTN- BP remains elevated today.  Continues to have intermittent leg swelling.  No CP, SOB, HAs, visual changes.  No N/V/D.   Review of Systems For ROS see HPI     Objective:   Physical Exam  Vitals reviewed. Constitutional: She is oriented to person, place, and time. She appears well-developed and well-nourished. No distress.  HENT:  Head: Normocephalic and atraumatic.  Eyes: Conjunctivae and EOM are normal. Pupils are equal, round, and reactive to light.  Neck: Normal range of motion. Neck supple. No thyromegaly present.  Cardiovascular: Normal rate, regular rhythm, normal heart sounds and intact distal pulses.   No murmur heard. Pulmonary/Chest: Effort normal and breath sounds normal. No respiratory distress.  Abdominal: Soft. She exhibits no distension. There is no tenderness.  Musculoskeletal: She exhibits edema (trace-1+ edema bilaterally).  Lymphadenopathy:    She has no cervical adenopathy.  Neurological: She is alert and oriented to person, place, and time.  Skin: Skin is warm and dry.  Psychiatric: She has a normal mood and affect. Her behavior is normal.          Assessment & Plan:

## 2013-06-14 NOTE — Patient Instructions (Addendum)
Follow up in 4-6 weeks to recheck BP and electrolytes Drink plenty of fluids Start the HCTZ daily for both BP and fluid retention Call with any questions or concerns Have a great trip!!!

## 2013-06-14 NOTE — Assessment & Plan Note (Signed)
New dx.  Start low dose HCTZ for both BP control and to improve edema.  Reviewed BMP from last visit.  Will continue to follow closely.  Pt expressed understanding and is in agreement w/ plan.

## 2013-07-20 ENCOUNTER — Ambulatory Visit (INDEPENDENT_AMBULATORY_CARE_PROVIDER_SITE_OTHER): Payer: BC Managed Care – PPO | Admitting: Family Medicine

## 2013-07-20 ENCOUNTER — Encounter: Payer: Self-pay | Admitting: Family Medicine

## 2013-07-20 VITALS — BP 110/80 | HR 77 | Temp 98.2°F | Ht 64.5 in | Wt 230.6 lb

## 2013-07-20 DIAGNOSIS — I10 Essential (primary) hypertension: Secondary | ICD-10-CM

## 2013-07-20 LAB — BASIC METABOLIC PANEL
BUN: 12 mg/dL (ref 6–23)
Calcium: 9.2 mg/dL (ref 8.4–10.5)
Chloride: 102 mEq/L (ref 96–112)
Sodium: 137 mEq/L (ref 135–145)

## 2013-07-20 NOTE — Assessment & Plan Note (Signed)
New dx.  BP much improved since starting HCTZ.  Currently asymptomatic.  Check BMP.  No anticipated changes.

## 2013-07-20 NOTE — Progress Notes (Signed)
  Subjective:    Patient ID: Nancy Spears, female    DOB: June 16, 1966, 47 y.o.   MRN: [id number]  HPI HTN- new dx.  Started on HCTZ last visit.  Swelling has improved.  No CP, SOB, HAs, visual changes, edema.   Review of Systems For ROS see HPI     Objective:   Physical Exam  Vitals reviewed. Constitutional: She is oriented to person, place, and time. She appears well-developed and well-nourished. No distress.  HENT:  Head: Normocephalic and atraumatic.  Eyes: Conjunctivae and EOM are normal. Pupils are equal, round, and reactive to light.  Neck: Normal range of motion. Neck supple. No thyromegaly present.  Cardiovascular: Normal rate, regular rhythm, normal heart sounds and intact distal pulses.   No murmur heard. Pulmonary/Chest: Effort normal and breath sounds normal. No respiratory distress.  Abdominal: Soft. She exhibits no distension. There is no tenderness.  Musculoskeletal: She exhibits no edema.  Lymphadenopathy:    She has no cervical adenopathy.  Neurological: She is alert and oriented to person, place, and time.  Skin: Skin is warm and dry.  Psychiatric: She has a normal mood and affect. Her behavior is normal.          Assessment & Plan:

## 2013-07-20 NOTE — Patient Instructions (Addendum)
Schedule your complete physical for December Continue the HCTZ daily Drink plenty of fluids We'll notify you of your lab results and make any changes if needed Enjoy your time off!!

## 2013-10-19 ENCOUNTER — Other Ambulatory Visit: Payer: Self-pay | Admitting: Family Medicine

## 2013-10-19 NOTE — Telephone Encounter (Signed)
Med filled.  

## 2013-10-21 ENCOUNTER — Other Ambulatory Visit: Payer: Self-pay

## 2013-11-04 ENCOUNTER — Other Ambulatory Visit: Payer: Self-pay

## 2013-11-04 DIAGNOSIS — Z1231 Encounter for screening mammogram for malignant neoplasm of breast: Secondary | ICD-10-CM

## 2013-12-03 ENCOUNTER — Ambulatory Visit
Admission: RE | Admit: 2013-12-03 | Discharge: 2013-12-03 | Disposition: A | Discharge status: Home / Self Care | Payer: BC Managed Care – PPO | Source: Ambulatory Visit

## 2013-12-03 DIAGNOSIS — Z1231 Encounter for screening mammogram for malignant neoplasm of breast: Secondary | ICD-10-CM

## 2013-12-20 ENCOUNTER — Other Ambulatory Visit: Payer: Self-pay | Admitting: Family Medicine

## 2013-12-20 DIAGNOSIS — R928 Other abnormal and inconclusive findings on diagnostic imaging of breast: Secondary | ICD-10-CM

## 2013-12-22 ENCOUNTER — Ambulatory Visit
Admission: RE | Admit: 2013-12-22 | Discharge: 2013-12-22 | Disposition: A | Discharge status: Home / Self Care | Payer: BC Managed Care – PPO | Source: Ambulatory Visit | Attending: Family Medicine | Admitting: Family Medicine

## 2013-12-22 DIAGNOSIS — R928 Other abnormal and inconclusive findings on diagnostic imaging of breast: Secondary | ICD-10-CM

## 2014-01-12 ENCOUNTER — Telehealth: Payer: Self-pay

## 2014-01-12 NOTE — Telephone Encounter (Signed)
Medication and allergies:  Reviewed and updated  90 day supply/mail order: n/a [facility]   Immunizations due:  Tetanus upon appt.   A/P: No changes to personal, family history or past surgical hx PAP-  09/17/12-normal MMG- 12/03/13-incomplete due to r breast mass, follow-up-12/22/13-negative Flu- 09/2013- per patient Tdap-Due   To Discuss with Provider:  Not at this time.

## 2014-01-14 ENCOUNTER — Ambulatory Visit (INDEPENDENT_AMBULATORY_CARE_PROVIDER_SITE_OTHER): Payer: BC Managed Care – PPO | Admitting: Family Medicine

## 2014-01-14 ENCOUNTER — Encounter: Payer: Self-pay | Admitting: Family Medicine

## 2014-01-14 VITALS — BP 120/78 | HR 83 | Temp 97.7°F | Resp 16 | Ht 65.0 in | Wt 229.0 lb

## 2014-01-14 DIAGNOSIS — E669 Obesity, unspecified: Secondary | ICD-10-CM

## 2014-01-14 DIAGNOSIS — Z23 Encounter for immunization: Secondary | ICD-10-CM

## 2014-01-14 DIAGNOSIS — Z0000 Encounter for general adult medical examination without abnormal findings: Secondary | ICD-10-CM

## 2014-01-14 LAB — CBC WITH DIFFERENTIAL/PLATELET
BASOS ABS: 0 10*3/uL (ref 0.0–0.1)
Basophils Relative: 0.6 % (ref 0.0–3.0)
EOS ABS: 0.1 10*3/uL (ref 0.0–0.7)
Eosinophils Relative: 1.7 % (ref 0.0–5.0)
HCT: 38.6 % (ref 36.0–46.0)
Hemoglobin: 12.2 g/dL (ref 12.0–15.0)
LYMPHS PCT: 32.5 % (ref 12.0–46.0)
Lymphs Abs: 1.8 10*3/uL (ref 0.7–4.0)
MCHC: 31.6 g/dL (ref 30.0–36.0)
MCV: 85.5 fl (ref 78.0–100.0)
Monocytes Absolute: 0.5 10*3/uL (ref 0.1–1.0)
Monocytes Relative: 9.4 % (ref 3.0–12.0)
NEUTROS PCT: 55.8 % (ref 43.0–77.0)
Neutro Abs: 3 10*3/uL (ref 1.4–7.7)
Platelets: 209 10*3/uL (ref 150.0–400.0)
RBC: 4.52 Mil/uL (ref 3.87–5.11)
RDW: 14.1 % (ref 11.5–14.6)
WBC: 5.5 10*3/uL (ref 4.5–10.5)

## 2014-01-14 LAB — BASIC METABOLIC PANEL
BUN: 13 mg/dL (ref 6–23)
CO2: 29 meq/L (ref 19–32)
Calcium: 9.2 mg/dL (ref 8.4–10.5)
Chloride: 99 mEq/L (ref 96–112)
Creatinine, Ser: 1 mg/dL (ref 0.4–1.2)
GFR: 77.56 mL/min (ref >60.00)
GLUCOSE: 107 mg/dL — AB (ref 70–99)
Potassium: 3.4 mEq/L — ABNORMAL LOW (ref 3.5–5.1)
Sodium: 135 mEq/L (ref 135–145)

## 2014-01-14 LAB — LIPID PANEL
Cholesterol: 204 mg/dL — ABNORMAL HIGH (ref 0–200)
HDL: 49.9 mg/dL (ref >39.00)
TRIGLYCERIDES: 82 mg/dL (ref 0.0–149.0)
Total CHOL/HDL Ratio: 4
VLDL: 16.4 mg/dL (ref 0.0–40.0)

## 2014-01-14 LAB — LDL CHOLESTEROL, DIRECT: LDL DIRECT: 151.1 mg/dL

## 2014-01-14 LAB — HEPATIC FUNCTION PANEL
ALBUMIN: 3.7 g/dL (ref 3.5–5.2)
ALK PHOS: 76 U/L (ref 39–117)
ALT: 18 U/L (ref 0–35)
AST: 17 U/L (ref 0–37)
Bilirubin, Direct: 0 mg/dL (ref 0.0–0.3)
TOTAL PROTEIN: 8 g/dL (ref 6.0–8.3)
Total Bilirubin: 0.6 mg/dL (ref 0.3–1.2)

## 2014-01-14 LAB — TSH: TSH: 1.21 u[IU]/mL (ref 0.35–5.50)

## 2014-01-14 NOTE — Progress Notes (Signed)
   Subjective:    Patient ID: Nancy Spears, female    DOB: 01/13/1966, 48 y.o.   MRN: [id number]  HPI CPE- UTD on GYN.  No concerns   Review of Systems Patient reports no vision/ hearing changes, adenopathy,fever, weight change,  persistant/recurrent hoarseness , swallowing issues, chest pain, palpitations, edema, persistant/recurrent cough, hemoptysis, dyspnea (rest/exertional/paroxysmal nocturnal), gastrointestinal bleeding (melena, rectal bleeding), abdominal pain, significant heartburn, bowel changes, GU symptoms (dysuria, hematuria, incontinence), Gyn symptoms (abnormal  bleeding, pain),  syncope, focal weakness, memory loss, numbness & tingling, skin/hair/nail changes, abnormal bruising or bleeding, anxiety, or depression.     Objective:   Physical Exam General Appearance:    Alert, cooperative, no distress, appears stated age, obese  Head:    Normocephalic, without obvious abnormality, atraumatic  Eyes:    PERRL, conjunctiva/corneas clear, EOM's intact, fundi    benign, both eyes  Ears:    Normal TM's and external ear canals, both ears  Nose:   Nares normal, septum midline, mucosa normal, no drainage    or sinus tenderness  Throat:   Lips, mucosa, and tongue normal; teeth and gums normal  Neck:   Supple, symmetrical, trachea midline, no adenopathy;    Thyroid: no enlargement/tenderness/nodules  Back:     Symmetric, no curvature, ROM normal, no CVA tenderness  Lungs:     Clear to auscultation bilaterally, respirations unlabored  Chest Wall:    No tenderness or deformity   Heart:    Regular rate and rhythm, S1 and S2 normal, no murmur, rub   or gallop  Breast Exam:    Deferred to GYN  Abdomen:     Soft, non-tender, bowel sounds active all four quadrants,    no masses, no organomegaly  Genitalia:    Deferred to GYN  Rectal:    Extremities:   Extremities normal, atraumatic, no cyanosis or edema  Pulses:   2+ and symmetric all extremities  Skin:   Skin color, texture, turgor  normal, no rashes or lesions  Lymph nodes:   Cervical, supraclavicular, and axillary nodes normal  Neurologic:   CNII-XII intact, normal strength, sensation and reflexes    throughout          Assessment & Plan:

## 2014-01-14 NOTE — Progress Notes (Signed)
Pre visit review using our clinic review tool, if applicable. No additional management support is needed unless otherwise documented below in the visit note. 

## 2014-01-14 NOTE — Assessment & Plan Note (Signed)
Pt's PE WNL w/ exception of obesity.  UTD on GYN.  Check labs.  Anticipatory guidance provided.  

## 2014-01-14 NOTE — Addendum Note (Signed)
Addended by: Jackson LatinoYLER, JESSICA L on: 01/14/2014 09:29 AM   Modules accepted: Orders

## 2014-01-14 NOTE — Assessment & Plan Note (Signed)
Encouraged regular, aerobic activity for 30 minutes at least 4x/week and monitoring caloric intake w/ the help of [employer]itness[employer] app.  

## 2014-01-14 NOTE — Patient Instructions (Signed)
Follow up in 6 months to recheck BP We'll notify you of your lab results and make any changes if needed Try and get regular exercise and make healthy food choices Call with any questions or concerns Happy [birth year] Birthday!

## 2014-01-18 LAB — VITAMIN D 1,25 DIHYDROXY
VITAMIN D 1, 25 (OH) TOTAL: 49 pg/mL (ref 18–72)
Vitamin D2 1, 25 (OH)2: 18 pg/mL
Vitamin D3 1, 25 (OH)2: 31 pg/mL

## 2014-02-15 ENCOUNTER — Other Ambulatory Visit: Payer: Self-pay | Admitting: Family Medicine

## 2014-02-15 NOTE — Telephone Encounter (Signed)
Med filled.  

## 2014-07-14 ENCOUNTER — Other Ambulatory Visit: Payer: Self-pay | Admitting: Family Medicine

## 2014-07-14 NOTE — Telephone Encounter (Signed)
Med filled.  

## 2014-10-18 LAB — HM PAP SMEAR: HM PAP: NORMAL

## 2014-10-25 ENCOUNTER — Encounter: Payer: Self-pay | Admitting: General Practice

## 2014-12-20 ENCOUNTER — Other Ambulatory Visit: Payer: Self-pay

## 2014-12-20 DIAGNOSIS — Z1231 Encounter for screening mammogram for malignant neoplasm of breast: Secondary | ICD-10-CM

## 2014-12-23 ENCOUNTER — Ambulatory Visit
Admission: RE | Admit: 2014-12-23 | Discharge: 2014-12-23 | Disposition: A | Discharge status: Home / Self Care | Payer: BC Managed Care – PPO | Source: Ambulatory Visit

## 2014-12-23 DIAGNOSIS — Z1231 Encounter for screening mammogram for malignant neoplasm of breast: Secondary | ICD-10-CM

## 2014-12-23 LAB — HM MAMMOGRAPHY: HM MAMMO: NORMAL

## 2015-01-10 ENCOUNTER — Ambulatory Visit (INDEPENDENT_AMBULATORY_CARE_PROVIDER_SITE_OTHER): Payer: BC Managed Care – PPO | Admitting: Family Medicine

## 2015-01-10 ENCOUNTER — Encounter: Payer: Self-pay | Admitting: Family Medicine

## 2015-01-10 VITALS — BP 126/86 | HR 65 | Temp 98.3°F | Resp 16 | Ht 65.5 in | Wt 229.1 lb

## 2015-01-10 DIAGNOSIS — Z1211 Encounter for screening for malignant neoplasm of colon: Secondary | ICD-10-CM

## 2015-01-10 DIAGNOSIS — Z0000 Encounter for general adult medical examination without abnormal findings: Secondary | ICD-10-CM

## 2015-01-10 LAB — LIPID PANEL
CHOL/HDL RATIO: 4
CHOLESTEROL: 192 mg/dL (ref 0–200)
HDL: 45 mg/dL (ref >39.00)
LDL Cholesterol: 125 mg/dL — ABNORMAL HIGH (ref 0–99)
NonHDL: 147
TRIGLYCERIDES: 111 mg/dL (ref 0.0–149.0)
VLDL: 22.2 mg/dL (ref 0.0–40.0)

## 2015-01-10 LAB — CBC WITH DIFFERENTIAL/PLATELET
BASOS ABS: 0 10*3/uL (ref 0.0–0.1)
BASOS PCT: 0.6 % (ref 0.0–3.0)
Eosinophils Absolute: 0.1 10*3/uL (ref 0.0–0.7)
Eosinophils Relative: 2.3 % (ref 0.0–5.0)
HCT: 36.5 % (ref 36.0–46.0)
Hemoglobin: 12.2 g/dL (ref 12.0–15.0)
Lymphocytes Relative: 35.4 % (ref 12.0–46.0)
Lymphs Abs: 1.7 10*3/uL (ref 0.7–4.0)
MCHC: 33.5 g/dL (ref 30.0–36.0)
MCV: 80.8 fl (ref 78.0–100.0)
Monocytes Absolute: 0.4 10*3/uL (ref 0.1–1.0)
Monocytes Relative: 8.9 % (ref 3.0–12.0)
NEUTROS ABS: 2.6 10*3/uL (ref 1.4–7.7)
NEUTROS PCT: 52.8 % (ref 43.0–77.0)
Platelets: 243 10*3/uL (ref 150.0–400.0)
RBC: 4.51 Mil/uL (ref 3.87–5.11)
RDW: 14.2 % (ref 11.5–15.5)
WBC: 4.8 10*3/uL (ref 4.0–10.5)

## 2015-01-10 LAB — HEPATIC FUNCTION PANEL
ALBUMIN: 3.8 g/dL (ref 3.5–5.2)
ALT: 12 U/L (ref 0–35)
AST: 15 U/L (ref 0–37)
Alkaline Phosphatase: 76 U/L (ref 39–117)
BILIRUBIN DIRECT: 0 mg/dL (ref 0.0–0.3)
Total Bilirubin: 0.3 mg/dL (ref 0.2–1.2)
Total Protein: 7.5 g/dL (ref 6.0–8.3)

## 2015-01-10 LAB — BASIC METABOLIC PANEL
BUN: 11 mg/dL (ref 6–23)
CO2: 27 meq/L (ref 19–32)
Calcium: 9 mg/dL (ref 8.4–10.5)
Chloride: 104 mEq/L (ref 96–112)
Creatinine, Ser: 0.78 mg/dL (ref 0.40–1.20)
GFR: 100.52 mL/min (ref >60.00)
GLUCOSE: 99 mg/dL (ref 70–99)
Potassium: 3.9 mEq/L (ref 3.5–5.1)
SODIUM: 138 meq/L (ref 135–145)

## 2015-01-10 LAB — TSH: TSH: 1.65 u[IU]/mL (ref 0.35–4.50)

## 2015-01-10 LAB — VITAMIN D 25 HYDROXY (VIT D DEFICIENCY, FRACTURES): VITD: 11.36 ng/mL — ABNORMAL LOW (ref 30.00–100.00)

## 2015-01-10 NOTE — Progress Notes (Signed)
   Subjective:    Patient ID: Nancy Spears, female    DOB: 02/25/1966, 49 y.o.   MRN: [id number]  HPI CPE- pt just turned 49, due for colonoscopy.  UTD on mammo, pap (Dr Stefano GaulStringer)  Review of Systems Patient reports no vision/ hearing changes, adenopathy,fever, weight change,  persistant/recurrent hoarseness , swallowing issues, chest pain, palpitations, edema, persistant/recurrent cough, hemoptysis, dyspnea (rest/exertional/paroxysmal nocturnal), gastrointestinal bleeding (melena, rectal bleeding), abdominal pain, significant heartburn, bowel changes, GU symptoms (dysuria, hematuria, incontinence), Gyn symptoms (abnormal  bleeding, pain),  syncope, focal weakness, memory loss, numbness & tingling, skin/hair/nail changes, abnormal bruising or bleeding, anxiety, or depression.  Following w/ eye doctor for glaucoma      Objective:   Physical Exam General Appearance:    Alert, cooperative, no distress, appears stated age, obese  Head:    Normocephalic, without obvious abnormality, atraumatic  Eyes:    PERRL, conjunctiva/corneas clear, EOM's intact, fundi    benign, both eyes  Ears:    Normal TM's and external ear canals, both ears  Nose:   Nares normal, septum midline, mucosa normal, no drainage    or sinus tenderness  Throat:   Lips, mucosa, and tongue normal; teeth and gums normal  Neck:   Supple, symmetrical, trachea midline, no adenopathy;    Thyroid: no enlargement/tenderness/nodules  Back:     Symmetric, no curvature, ROM normal, no CVA tenderness  Lungs:     Clear to auscultation bilaterally, respirations unlabored  Chest Wall:    No tenderness or deformity   Heart:    Regular rate and rhythm, S1 and S2 normal, no murmur, rub   or gallop  Breast Exam:    Deferred to GYN  Abdomen:     Soft, non-tender, bowel sounds active all four quadrants,    no masses, no organomegaly  Genitalia:    Deferred to GYN  Rectal:    Extremities:   Extremities normal, atraumatic, no cyanosis or edema    Pulses:   2+ and symmetric all extremities  Skin:   Skin color, texture, turgor normal, no rashes or lesions  Lymph nodes:   Cervical, supraclavicular, and axillary nodes normal  Neurologic:   CNII-XII intact, normal strength, sensation and reflexes    throughout          Assessment & Plan:

## 2015-01-10 NOTE — Assessment & Plan Note (Signed)
Pt's PE WNL w/ exception of obesity.  UTD on pap, mammo.  Now that she is 49, due for colonoscopy- referral placed.  Check labs.  Anticipatory guidance provided.

## 2015-01-10 NOTE — Progress Notes (Signed)
Pre visit review using our clinic review tool, if applicable. No additional management support is needed unless otherwise documented below in the visit note. 

## 2015-01-10 NOTE — Patient Instructions (Signed)
Follow up in 6 months to recheck BP and cholesterol We'll notify you of your lab results and make any changes if needed Try and make healthy food choices and get regular exercise We'll call you with your GI appt Call with any questions or concerns HAPPY [birth year] BIR[employer]DAY!!!

## 2015-01-11 ENCOUNTER — Encounter: Payer: Self-pay | Admitting: Internal Medicine

## 2015-01-11 ENCOUNTER — Other Ambulatory Visit: Payer: Self-pay | Admitting: General Practice

## 2015-01-11 MED ORDER — VITAMIN D (ERGOCALCIFEROL) 1.25 MG (50000 UNIT) PO CAPS: 50000.0000 [IU] | ORAL_CAPSULE | ORAL | Status: DC

## 2015-02-27 ENCOUNTER — Ambulatory Visit (AMBULATORY_SURGERY_CENTER): Payer: Self-pay

## 2015-02-27 VITALS — Ht 64.0 in | Wt 229.0 lb

## 2015-02-27 DIAGNOSIS — Z1211 Encounter for screening for malignant neoplasm of colon: Secondary | ICD-10-CM

## 2015-02-27 MED ORDER — MOVIPREP 100 G PO SOLR: 1.0000 | Freq: Once | ORAL | Status: DC

## 2015-02-27 NOTE — Progress Notes (Signed)
No allergies to eggs or soy No home oxygen No diet/weight loss meds No past problems with anesthesia  Has email  Emmi instructions given for colonoscopy 

## 2015-03-03 ENCOUNTER — Other Ambulatory Visit: Payer: Self-pay | Admitting: General Practice

## 2015-03-03 MED ORDER — HYDROCHLOROTHIAZIDE 12.5 MG PO TABS: 12.5000 mg | ORAL_TABLET | Freq: Every day | ORAL | Status: DC

## 2015-03-13 ENCOUNTER — Ambulatory Visit (AMBULATORY_SURGERY_CENTER): Payer: BC Managed Care – PPO | Admitting: Internal Medicine

## 2015-03-13 ENCOUNTER — Encounter: Payer: Self-pay | Admitting: Internal Medicine

## 2015-03-13 VITALS — BP 117/80 | HR 66 | Temp 97.0°F | Resp 18 | Ht 64.0 in | Wt 229.0 lb

## 2015-03-13 DIAGNOSIS — Z1211 Encounter for screening for malignant neoplasm of colon: Secondary | ICD-10-CM

## 2015-03-13 MED ORDER — SODIUM CHLORIDE 0.9 % IV SOLN: 500.0000 mL | INTRAVENOUS | Status: DC

## 2015-03-13 NOTE — Progress Notes (Signed)
A/ox3 pleased with MAC, report to Wendy RN 

## 2015-03-13 NOTE — Patient Instructions (Addendum)
YOU HAD AN ENDOSCOPIC PROCEDURE TODAY AT THE [facility]:   Refer to the procedure report that was given to you for any specific questions about what was found during the examination.  If the procedure report does not answer your questions, please call your gastroenterologist to clarify.  If you requested that your care partner not be given the details of your procedure findings, then the procedure report has been included in a sealed envelope for you to review at your convenience later.  YOU SHOULD EXPECT: Some feelings of bloating in the abdomen. Passage of more gas than usual.  Walking can help get rid of the air that was put into your GI tract during the procedure and reduce the bloating. If you had a lower endoscopy (such as a colonoscopy or flexible sigmoidoscopy) you may notice spotting of blood in your stool or on the toilet paper. If you underwent a bowel prep for your procedure, you may not have a normal bowel movement for a few days.  Please Note:  You might notice some irritation and congestion in your nose or some drainage.  This is from the oxygen used during your procedure.  There is no need for concern and it should clear up in a day or so.  SYMPTOMS TO REPORT IMMEDIATELY:   Following lower endoscopy (colonoscopy or flexible sigmoidoscopy):  Excessive amounts of blood in the stool  Significant tenderness or worsening of abdominal pains  Swelling of the abdomen that is new, acute  Fever of 100F or higher   For urgent or emergent issues, a gastroenterologist can be reached at any hour by calling [phone].   DIET: Your first meal following the procedure should be a small meal and then it is ok to progress to your normal diet. Heavy or fried foods are harder to digest and may make you feel nauseous or bloated.  Likewise, meals heavy in dairy and vegetables can increase bloating.  Drink plenty of fluids but you should avoid alcoholic beverages for 24  hours.  ACTIVITY:  You should plan to take it easy for the rest of today and you should NOT DRIVE or use heavy machinery until tomorrow (because of the sedation medicines used during the test).    FOLLOW UP: Our staff will call the number listed on your records the next business day following your procedure to check on you and address any questions or concerns that you may have regarding the information given to you following your procedure. If we do not reach you, we will leave a message.  However, if you are feeling well and you are not experiencing any problems, there is no need to return our call.  We will assume that you have returned to your regular daily activities without incident.  If any biopsies were taken you will be contacted by phone or by letter within the next 1-3 weeks.  Please call us at [phone] if you have not heard about the biopsies in 3 weeks.    SIGNATURES/CONFIDENTIALITY: You and/or your care partner have signed paperwork which will be entered into your electronic medical record.  These signatures attest to the fact that that the information above on your After Visit Summary has been reviewed and is understood.  Full responsibility of the confidentiality of this discharge information lies with you and/or your care-partner.  Recommendations Discharge instructions given to patient and/or care partner. Next routine colonoscopy in 10 years. High fiber diet handout provided.

## 2015-03-13 NOTE — Op Note (Signed)
[facility] [address] [place], [id number]   COLONOSCOPY PROCEDURE REPORT  PATIENT: Nancy Spears, Nancy Spears  MR#: [id number] BIRTHDATE: [birth year] , 49  yrs. old GENDER: female ENDOSCOPIST: Hart Carwinora M Brodie, MD REFERRED XB:JYNWGNFAOBY:Katherine Assunta FoundE Tabori, M.D. PROCEDURE DATE:  03/13/2015 PROCEDURE:   Colonoscopy, screening First Screening Colonoscopy - Avg.  risk and is 50 yrs.  old or older Yes.  Prior Negative Screening - Now for repeat screening. N/A  History of Adenoma - Now for follow-up colonoscopy & has been > or = to 3 yrs.  N/A ASA CLASS:   Class I INDICATIONS:Screening for colonic neoplasia and Colorectal Neoplasm Risk Assessment for this procedure is average risk. MEDICATIONS: Monitored anesthesia care and Propofol 180 mg IV  DESCRIPTION OF PROCEDURE:   After the risks benefits and alternatives of the procedure were thoroughly explained, informed consent was obtained.  The digital rectal exam revealed no abnormalities of the rectum.   The LB PFC-H190 [id number]  endoscope was introduced through the anus and advanced to the cecum, which was identified by both the appendix and ileocecal valve. No adverse events experienced.   The quality of the prep was excellent. (MoviPrep was used)  The instrument was then slowly withdrawn as the colon was fully examined.      COLON FINDINGS: A normal appearing cecum, ileocecal valve, and appendiceal orifice were identified.  The ascending, transverse, descending, sigmoid colon, and rectum appeared unremarkable. Retroflexed views revealed no abnormalities. The time to cecum = 3.51 Withdrawal time = 6.15   The scope was withdrawn and the procedure completed. COMPLICATIONS: There were no immediate complications.  ENDOSCOPIC IMPRESSION: Normal colonoscopy  RECOMMENDATIONS: High fiber diet Recall colonoscopy in 10 years  eSigned:  Hart Carwinora M Brodie, MD 03/13/2015 10:00 AM   cc:

## 2015-03-14 ENCOUNTER — Telehealth: Payer: Self-pay | Admitting: *Deleted

## 2015-03-14 NOTE — Telephone Encounter (Signed)
  Follow up Call-  Call back number 03/13/2015  Post procedure Call Back phone  # [phone]  Permission to leave phone message Yes     Patient questions:  Do you have a fever, pain , or abdominal swelling? No. Pain Score  0 *  Have you tolerated food without any problems? Yes.    Have you been able to return to your normal activities? Yes.    Do you have any questions about your discharge instructions: Diet   No. Medications  No. Follow up visit  No.  Do you have questions or concerns about your Care? No.  Actions: * If pain score is 4 or above: No action needed, pain <4.

## 2015-03-31 NOTE — Addendum Note (Signed)
Addended by: Gillermina HuMCCRAW, Toa Mia W on: 03/31/2015 03:57 PM   Modules accepted: Level of Service

## 2015-07-12 ENCOUNTER — Ambulatory Visit: Payer: BC Managed Care – PPO | Admitting: Family Medicine

## 2015-08-09 ENCOUNTER — Ambulatory Visit (INDEPENDENT_AMBULATORY_CARE_PROVIDER_SITE_OTHER): Payer: BC Managed Care – PPO | Admitting: Family Medicine

## 2015-08-09 ENCOUNTER — Encounter: Payer: Self-pay | Admitting: Family Medicine

## 2015-08-09 VITALS — BP 118/80 | HR 70 | Temp 98.0°F | Resp 16 | Ht 64.0 in | Wt 229.2 lb

## 2015-08-09 DIAGNOSIS — E785 Hyperlipidemia, unspecified: Secondary | ICD-10-CM | POA: Diagnosis not present

## 2015-08-09 DIAGNOSIS — I10 Essential (primary) hypertension: Secondary | ICD-10-CM

## 2015-08-09 DIAGNOSIS — E669 Obesity, unspecified: Secondary | ICD-10-CM

## 2015-08-09 MED ORDER — HYDROCHLOROTHIAZIDE 12.5 MG PO TABS: 12.5000 mg | ORAL_TABLET | Freq: Every day | ORAL | Status: DC

## 2015-08-09 NOTE — Progress Notes (Signed)
   Subjective:    Patient ID: Nancy Spears, female    DOB: Jun 30, 1966, 49 y.o.   MRN: [id number]  HPI HTN- chronic problem.  Attempting to control w/ healthy diet and regular exercise.  Taking HCTZ as needed for edema.  Pt is attempting to walk regularly.  Plans to increase activity level.  Attempting to make healthy food choices and her primary goal is to not skip meals.  Denies CP, SOB, HAs, visual changes, edema.  Hyperlipidemia- LDL was noted to be elevated previously but pt is attempting to control w/ healthy diet and regular exercise.  Denies abd pain, N/V, myalgias.  Obesity- ongoing issue for pt.  Attempting to exercise more regularly and make better food choices.  Review of Systems For ROS see HPI     Objective:   Physical Exam  Constitutional: She is oriented to person, place, and time. She appears well-developed and well-nourished. No distress.  HENT:  Head: Normocephalic and atraumatic.  Eyes: Conjunctivae and EOM are normal. Pupils are equal, round, and reactive to light.  Neck: Normal range of motion. Neck supple. No thyromegaly present.  Cardiovascular: Normal rate, regular rhythm, normal heart sounds and intact distal pulses.   No murmur heard. Pulmonary/Chest: Effort normal and breath sounds normal. No respiratory distress.  Abdominal: Soft. She exhibits no distension. There is no tenderness.  Musculoskeletal: She exhibits no edema.  Lymphadenopathy:    She has no cervical adenopathy.  Neurological: She is alert and oriented to person, place, and time.  Skin: Skin is warm and dry.  Psychiatric: She has a normal mood and affect. Her behavior is normal.  Vitals reviewed.         Assessment & Plan:

## 2015-08-09 NOTE — Progress Notes (Signed)
Pre visit review using our clinic review tool, if applicable. No additional management support is needed unless otherwise documented below in the visit note. 

## 2015-08-09 NOTE — Patient Instructions (Signed)
Schedule your complete physical in 6 months We'll notify you of your lab results and make any changes if needed Continue to work on healthy diet and regular exercise Increase your water intake daily Call with any questions or concerns Good Luck with Back to School

## 2015-08-10 ENCOUNTER — Other Ambulatory Visit: Payer: Self-pay | Admitting: General Practice

## 2015-08-10 DIAGNOSIS — E785 Hyperlipidemia, unspecified: Secondary | ICD-10-CM

## 2015-08-10 LAB — BASIC METABOLIC PANEL
BUN: 11 mg/dL (ref 6–23)
CO2: 30 mEq/L (ref 19–32)
Calcium: 9.6 mg/dL (ref 8.4–10.5)
Chloride: 98 mEq/L (ref 96–112)
Creatinine, Ser: 0.87 mg/dL (ref 0.40–1.20)
GFR: 88.41 mL/min (ref >60.00)
Glucose, Bld: 79 mg/dL (ref 70–99)
Potassium: 4 mEq/L (ref 3.5–5.1)
Sodium: 137 mEq/L (ref 135–145)

## 2015-08-10 LAB — LIPID PANEL
Cholesterol: 213 mg/dL — ABNORMAL HIGH (ref 0–200)
HDL: 51.5 mg/dL (ref >39.00)
LDL CALC: 144 mg/dL — AB (ref 0–99)
NonHDL: 161.45
Total CHOL/HDL Ratio: 4
Triglycerides: 89 mg/dL (ref 0.0–149.0)
VLDL: 17.8 mg/dL (ref 0.0–40.0)

## 2015-08-10 LAB — HEPATIC FUNCTION PANEL
ALT: 14 U/L (ref 0–35)
AST: 15 U/L (ref 0–37)
Albumin: 4.2 g/dL (ref 3.5–5.2)
Alkaline Phosphatase: 81 U/L (ref 39–117)
Bilirubin, Direct: 0 mg/dL (ref 0.0–0.3)
Total Bilirubin: 0.3 mg/dL (ref 0.2–1.2)
Total Protein: 8.2 g/dL (ref 6.0–8.3)

## 2015-08-10 LAB — CBC WITH DIFFERENTIAL/PLATELET
BASOS ABS: 0.1 10*3/uL (ref 0.0–0.1)
Basophils Relative: 1.9 % (ref 0.0–3.0)
Eosinophils Absolute: 0.1 10*3/uL (ref 0.0–0.7)
Eosinophils Relative: 0.8 % (ref 0.0–5.0)
HCT: 38.8 % (ref 36.0–46.0)
Hemoglobin: 12.7 g/dL (ref 12.0–15.0)
LYMPHS ABS: 2.3 10*3/uL (ref 0.7–4.0)
Lymphocytes Relative: 30.2 % (ref 12.0–46.0)
MCHC: 32.8 g/dL (ref 30.0–36.0)
MCV: 83.2 fl (ref 78.0–100.0)
MONO ABS: 0.7 10*3/uL (ref 0.1–1.0)
MONOS PCT: 8.4 % (ref 3.0–12.0)
NEUTROS PCT: 58.7 % (ref 43.0–77.0)
Neutro Abs: 4.6 10*3/uL (ref 1.4–7.7)
Platelets: 231 10*3/uL (ref 150.0–400.0)
RBC: 4.66 Mil/uL (ref 3.87–5.11)
RDW: 14.2 % (ref 11.5–15.5)
WBC: 7.8 10*3/uL (ref 4.0–10.5)

## 2015-08-10 MED ORDER — ATORVASTATIN CALCIUM 20 MG PO TABS: 20.0000 mg | ORAL_TABLET | Freq: Every day | ORAL | Status: DC

## 2015-08-14 NOTE — Assessment & Plan Note (Signed)
Chronic problem.  Adequate control.  Asymptomatic.  Stressed need for healthy diet, regular exercise.  Check labs.  No anticipated med changes.

## 2015-08-14 NOTE — Assessment & Plan Note (Signed)
Ongoing issue for pt.  Again encouraged healthy diet and regular exercise.  Check labs to risk stratify.  Will follow.

## 2015-08-14 NOTE — Assessment & Plan Note (Signed)
Chronic problem.  Attempting to control w/ healthy diet/regular exercise.  Check labs.  Start statin prn.  Pt expressed understanding and is in agreement w/ plan.

## 2015-10-26 LAB — HM PAP SMEAR

## 2015-11-06 ENCOUNTER — Encounter: Payer: Self-pay | Admitting: General Practice

## 2015-12-28 ENCOUNTER — Other Ambulatory Visit: Payer: Self-pay

## 2015-12-28 ENCOUNTER — Other Ambulatory Visit: Payer: Self-pay | Admitting: Family Medicine

## 2015-12-28 DIAGNOSIS — Z1231 Encounter for screening mammogram for malignant neoplasm of breast: Secondary | ICD-10-CM

## 2016-01-05 ENCOUNTER — Ambulatory Visit
Admission: RE | Admit: 2016-01-05 | Discharge: 2016-01-05 | Disposition: A | Discharge status: Home / Self Care | Payer: BC Managed Care – PPO | Source: Ambulatory Visit

## 2016-01-05 DIAGNOSIS — Z1231 Encounter for screening mammogram for malignant neoplasm of breast: Secondary | ICD-10-CM

## 2016-02-12 ENCOUNTER — Telehealth: Payer: Self-pay | Admitting: Behavioral Health

## 2016-02-12 ENCOUNTER — Encounter: Payer: Self-pay | Admitting: Behavioral Health

## 2016-02-12 NOTE — Telephone Encounter (Signed)
Pre-Visit Call completed with patient and chart updated.   Pre-Visit Info documented in Specialty Comments under [employer].    

## 2016-02-13 ENCOUNTER — Encounter: Payer: Self-pay | Admitting: Family Medicine

## 2016-02-13 ENCOUNTER — Ambulatory Visit (INDEPENDENT_AMBULATORY_CARE_PROVIDER_SITE_OTHER): Payer: BC Managed Care – PPO | Admitting: Family Medicine

## 2016-02-13 VITALS — BP 128/78 | HR 79 | Temp 98.4°F | Ht 64.0 in | Wt 227.4 lb

## 2016-02-13 DIAGNOSIS — Z0000 Encounter for general adult medical examination without abnormal findings: Secondary | ICD-10-CM | POA: Diagnosis not present

## 2016-02-13 LAB — BASIC METABOLIC PANEL
BUN: 12 mg/dL (ref 6–23)
CO2: 29 meq/L (ref 19–32)
Calcium: 9.6 mg/dL (ref 8.4–10.5)
Chloride: 102 mEq/L (ref 96–112)
Creatinine, Ser: 0.84 mg/dL (ref 0.40–1.20)
GFR: 91.88 mL/min (ref >60.00)
GLUCOSE: 103 mg/dL — AB (ref 70–99)
POTASSIUM: 3.7 meq/L (ref 3.5–5.1)
Sodium: 137 mEq/L (ref 135–145)

## 2016-02-13 LAB — LIPID PANEL
CHOLESTEROL: 202 mg/dL — AB (ref 0–200)
HDL: 51.2 mg/dL (ref >39.00)
LDL CALC: 133 mg/dL — AB (ref 0–99)
NonHDL: 150.36
TRIGLYCERIDES: 88 mg/dL (ref 0.0–149.0)
Total CHOL/HDL Ratio: 4
VLDL: 17.6 mg/dL (ref 0.0–40.0)

## 2016-02-13 LAB — HEPATIC FUNCTION PANEL
ALK PHOS: 74 U/L (ref 39–117)
ALT: 19 U/L (ref 0–35)
AST: 21 U/L (ref 0–37)
Albumin: 4.1 g/dL (ref 3.5–5.2)
BILIRUBIN DIRECT: 0 mg/dL (ref 0.0–0.3)
TOTAL PROTEIN: 8 g/dL (ref 6.0–8.3)
Total Bilirubin: 0.4 mg/dL (ref 0.2–1.2)

## 2016-02-13 LAB — CBC WITH DIFFERENTIAL/PLATELET
Basophils Absolute: 0 10*3/uL (ref 0.0–0.1)
Basophils Relative: 0.6 % (ref 0.0–3.0)
EOS ABS: 0.1 10*3/uL (ref 0.0–0.7)
Eosinophils Relative: 1.6 % (ref 0.0–5.0)
HEMATOCRIT: 37.3 % (ref 36.0–46.0)
Hemoglobin: 12.2 g/dL (ref 12.0–15.0)
LYMPHS PCT: 34.8 % (ref 12.0–46.0)
Lymphs Abs: 1.9 10*3/uL (ref 0.7–4.0)
MCHC: 32.8 g/dL (ref 30.0–36.0)
MCV: 82.2 fl (ref 78.0–100.0)
MONO ABS: 0.5 10*3/uL (ref 0.1–1.0)
Monocytes Relative: 9.8 % (ref 3.0–12.0)
NEUTROS ABS: 2.9 10*3/uL (ref 1.4–7.7)
Neutrophils Relative %: 53.2 % (ref 43.0–77.0)
PLATELETS: 227 10*3/uL (ref 150.0–400.0)
RBC: 4.54 Mil/uL (ref 3.87–5.11)
RDW: 13.7 % (ref 11.5–15.5)
WBC: 5.5 10*3/uL (ref 4.0–10.5)

## 2016-02-13 LAB — TSH: TSH: 1.07 u[IU]/mL (ref 0.35–4.50)

## 2016-02-13 LAB — VITAMIN D 25 HYDROXY (VIT D DEFICIENCY, FRACTURES): VITD: 16.04 ng/mL — AB (ref 30.00–100.00)

## 2016-02-13 NOTE — Progress Notes (Signed)
   Subjective:    Patient ID: Nancy Spears, female    DOB: 09-Nov-1966, 50 y.o.   MRN: [id number]  HPI CPE- UTD on GYN (pap/mammo), colonoscopy.     Review of Systems Patient reports no vision/ hearing changes, adenopathy,fever, weight change,  persistant/recurrent hoarseness , swallowing issues, chest pain, palpitations, edema, persistant/recurrent cough, hemoptysis, dyspnea (rest/exertional/paroxysmal nocturnal), gastrointestinal bleeding (melena, rectal bleeding), abdominal pain, significant heartburn, bowel changes, GU symptoms (dysuria, hematuria, incontinence), Gyn symptoms (abnormal  bleeding, pain),  syncope, focal weakness, memory loss, numbness & tingling, skin/hair/nail changes, abnormal bruising or bleeding, anxiety, or depression.     Objective:   Physical Exam General Appearance:    Alert, cooperative, no distress, appears stated age, obese  Head:    Normocephalic, without obvious abnormality, atraumatic  Eyes:    PERRL, conjunctiva/corneas clear, EOM's intact, fundi    benign, both eyes  Ears:    Normal TM's and external ear canals, both ears  Nose:   Nares normal, septum midline, mucosa normal, no drainage    or sinus tenderness  Throat:   Lips, mucosa, and tongue normal; teeth and gums normal  Neck:   Supple, symmetrical, trachea midline, no adenopathy;    Thyroid: no enlargement/tenderness/nodules  Back:     Symmetric, no curvature, ROM normal, no CVA tenderness  Lungs:     Clear to auscultation bilaterally, respirations unlabored  Chest Wall:    No tenderness or deformity   Heart:    Regular rate and rhythm, S1 and S2 normal, no murmur, rub   or gallop  Breast Exam:    Deferred to GYN  Abdomen:     Soft, non-tender, bowel sounds active all four quadrants,    no masses, no organomegaly  Genitalia:    Deferred to GYN  Rectal:    Extremities:   Extremities normal, atraumatic, no cyanosis or edema  Pulses:   2+ and symmetric all extremities  Skin:   Skin color,  texture, turgor normal, no rashes or lesions  Lymph nodes:   Cervical, supraclavicular, and axillary nodes normal  Neurologic:   CNII-XII intact, normal strength, sensation and reflexes    throughout          Assessment & Plan:

## 2016-02-13 NOTE — Progress Notes (Signed)
Pre visit review using our clinic review tool, if applicable. No additional management support is needed unless otherwise documented below in the visit note. 

## 2016-02-13 NOTE — Assessment & Plan Note (Signed)
Pt's PE w/ exception of obesity.  UTD on GYN, colonoscopy.  Check labs.  Anticipatory guidance provided.

## 2016-02-13 NOTE — Patient Instructions (Signed)
Follow up in 6 months to recheck cholesterol and BP We'll notify you of your lab results and make any changes if needed Continue to work on healthy diet and regular exercise- you can do it! Call with any questions or concerns If you want to join [place] at the new [place] office, any scheduled appointments will automatically transfer and we will see you at [address] [place] [address], [place], [place] [address] [facility]) Have a great week!!!

## 2016-02-14 ENCOUNTER — Other Ambulatory Visit: Payer: Self-pay | Admitting: General Practice

## 2016-02-14 MED ORDER — VITAMIN D (ERGOCALCIFEROL) 1.25 MG (50000 UNIT) PO CAPS: 50000.0000 [IU] | ORAL_CAPSULE | ORAL | Status: DC

## 2016-08-12 ENCOUNTER — Encounter: Payer: Self-pay | Admitting: Family Medicine

## 2016-08-12 ENCOUNTER — Ambulatory Visit (INDEPENDENT_AMBULATORY_CARE_PROVIDER_SITE_OTHER): Payer: BC Managed Care – PPO | Admitting: Family Medicine

## 2016-08-12 VITALS — BP 122/86 | HR 62 | Temp 99.0°F | Resp 16 | Ht 64.0 in | Wt 227.1 lb

## 2016-08-12 DIAGNOSIS — I10 Essential (primary) hypertension: Secondary | ICD-10-CM

## 2016-08-12 DIAGNOSIS — E785 Hyperlipidemia, unspecified: Secondary | ICD-10-CM

## 2016-08-12 LAB — HEPATIC FUNCTION PANEL
ALT: 12 U/L (ref 0–35)
AST: 13 U/L (ref 0–37)
Albumin: 3.9 g/dL (ref 3.5–5.2)
Alkaline Phosphatase: 79 U/L (ref 39–117)
BILIRUBIN DIRECT: 0 mg/dL (ref 0.0–0.3)
BILIRUBIN TOTAL: 0.4 mg/dL (ref 0.2–1.2)
Total Protein: 7.3 g/dL (ref 6.0–8.3)

## 2016-08-12 LAB — CBC WITH DIFFERENTIAL/PLATELET
BASOS PCT: 2.7 % (ref 0.0–3.0)
Basophils Absolute: 0.2 10*3/uL — ABNORMAL HIGH (ref 0.0–0.1)
EOS PCT: 2 % (ref 0.0–5.0)
Eosinophils Absolute: 0.1 10*3/uL (ref 0.0–0.7)
HEMATOCRIT: 37.1 % (ref 36.0–46.0)
HEMOGLOBIN: 12.2 g/dL (ref 12.0–15.0)
LYMPHS PCT: 32.8 % (ref 12.0–46.0)
Lymphs Abs: 1.9 10*3/uL (ref 0.7–4.0)
MCHC: 32.9 g/dL (ref 30.0–36.0)
MCV: 83.4 fl (ref 78.0–100.0)
MONOS PCT: 8.9 % (ref 3.0–12.0)
Monocytes Absolute: 0.5 10*3/uL (ref 0.1–1.0)
NEUTROS ABS: 3 10*3/uL (ref 1.4–7.7)
Neutrophils Relative %: 53.6 % (ref 43.0–77.0)
PLATELETS: 220 10*3/uL (ref 150.0–400.0)
RBC: 4.45 Mil/uL (ref 3.87–5.11)
RDW: 14.1 % (ref 11.5–15.5)
WBC: 5.7 10*3/uL (ref 4.0–10.5)

## 2016-08-12 LAB — BASIC METABOLIC PANEL
BUN: 16 mg/dL (ref 6–23)
CHLORIDE: 103 meq/L (ref 96–112)
CO2: 31 mEq/L (ref 19–32)
CREATININE: 0.99 mg/dL (ref 0.40–1.20)
Calcium: 8.9 mg/dL (ref 8.4–10.5)
GFR: 75.86 mL/min (ref >60.00)
Glucose, Bld: 112 mg/dL — ABNORMAL HIGH (ref 70–99)
Potassium: 4.1 mEq/L (ref 3.5–5.1)
Sodium: 139 mEq/L (ref 135–145)

## 2016-08-12 LAB — LIPID PANEL
CHOL/HDL RATIO: 4
Cholesterol: 219 mg/dL — ABNORMAL HIGH (ref 0–200)
HDL: 50.8 mg/dL (ref >39.00)
LDL CALC: 147 mg/dL — AB (ref 0–99)
NONHDL: 168.67
Triglycerides: 107 mg/dL (ref 0.0–149.0)
VLDL: 21.4 mg/dL (ref 0.0–40.0)

## 2016-08-12 NOTE — Assessment & Plan Note (Signed)
Chronic problem.  Tolerating statin w/o difficulty.  Encouraged healthy diet and regular exercise.  Check labs.  Adjust meds prn. 

## 2016-08-12 NOTE — Progress Notes (Signed)
   Subjective:    Patient ID: Nancy Spears, female    DOB: 07/18/1966, 50 y.o.   MRN: [id number]  HPI HTN- chronic problem, on HCTZ daily w/ good control.  Pt reports fatigue but no other complaints.  No CP, SOB, HAs, visual changes, edema.  Hyperlipidemia- chronic problem, on Lipitor daily.  Denies abd pain, N/V, myalgias.  No regular exercise.   Review of Systems For ROS see HPI     Objective:   Physical Exam  Constitutional: She is oriented to person, place, and time. She appears well-developed and well-nourished. No distress.  HENT:  Head: Normocephalic and atraumatic.  Eyes: Conjunctivae and EOM are normal. Pupils are equal, round, and reactive to light.  Neck: Normal range of motion. Neck supple. No thyromegaly present.  Cardiovascular: Normal rate, regular rhythm, normal heart sounds and intact distal pulses.   No murmur heard. Pulmonary/Chest: Effort normal and breath sounds normal. No respiratory distress.  Abdominal: Soft. She exhibits no distension. There is no tenderness.  Musculoskeletal: She exhibits no edema.  Lymphadenopathy:    She has no cervical adenopathy.  Neurological: She is alert and oriented to person, place, and time.  Skin: Skin is warm and dry.  Psychiatric: She has a normal mood and affect. Her behavior is normal.  Vitals reviewed.         Assessment & Plan:

## 2016-08-12 NOTE — Assessment & Plan Note (Signed)
Chronic problem.  Well controlled on HCTZ.  Asymptomatic at this time.  Check labs.  No anticipated med changes. 

## 2016-08-12 NOTE — Patient Instructions (Signed)
Schedule your complete physical in 6 months We'll notify you of your lab results and make any changes if needed Try and make healthy food choices and get regular exercise Call with any questions or concerns Happy Labor Day!!! 

## 2016-08-12 NOTE — Progress Notes (Signed)
Pre visit review using our clinic review tool, if applicable. No additional management support is needed unless otherwise documented below in the visit note. 

## 2016-08-13 ENCOUNTER — Other Ambulatory Visit (INDEPENDENT_AMBULATORY_CARE_PROVIDER_SITE_OTHER): Payer: BC Managed Care – PPO

## 2016-08-13 DIAGNOSIS — Q998 Other specified chromosome abnormalities: Secondary | ICD-10-CM

## 2016-08-13 DIAGNOSIS — Q738 Other reduction defects of unspecified limb(s): Secondary | ICD-10-CM

## 2016-08-13 LAB — HEMOGLOBIN A1C: HEMOGLOBIN A1C: 6.3 % (ref 4.6–6.5)

## 2016-10-14 ENCOUNTER — Other Ambulatory Visit: Payer: Self-pay | Admitting: Family Medicine

## 2016-11-04 ENCOUNTER — Encounter: Payer: Self-pay | Admitting: Family Medicine

## 2016-11-04 ENCOUNTER — Ambulatory Visit (INDEPENDENT_AMBULATORY_CARE_PROVIDER_SITE_OTHER): Payer: BC Managed Care – PPO | Admitting: Family Medicine

## 2016-11-04 VITALS — BP 121/83 | HR 86 | Temp 98.9°F | Resp 17 | Ht 64.0 in | Wt 228.2 lb

## 2016-11-04 DIAGNOSIS — J302 Other seasonal allergic rhinitis: Secondary | ICD-10-CM

## 2016-11-04 MED ORDER — FLUTICASONE PROPIONATE 50 MCG/ACT NA SUSP: 2.0000 | Freq: Every day | NASAL | 6 refills | Status: DC

## 2016-11-04 NOTE — Progress Notes (Signed)
   Subjective:    Patient ID: Nancy Spears, female    DOB: 01/06/1966, 50 y.o.   MRN: [id number]  HPI URI- sxs started a few weeks ago w/ nasal congestion, PND, dry, scratchy throat.  Intermittent cough.  This AM had cough that had blood tinged sputum.  No facial pain/pressure.  No fevers, ear pain, N/V.  Warm compresses help her head discomfort.  Pt reports this feels similar to previous sinusitis.  Husband recently went to ER w/ viral URI sxs.   Review of Systems For ROS see HPI     Objective:   Physical Exam  Constitutional: She is oriented to person, place, and time. She appears well-developed and well-nourished. No distress.  HENT:  Head: Normocephalic and atraumatic.  Right Ear: Tympanic membrane normal.  Left Ear: Tympanic membrane normal.  Nose: Mucosal edema and rhinorrhea present. Right sinus exhibits no maxillary sinus tenderness and no frontal sinus tenderness. Left sinus exhibits no maxillary sinus tenderness and no frontal sinus tenderness.  Mouth/Throat: Mucous membranes are normal. Posterior oropharyngeal erythema (w/ PND) present.  Eyes: Conjunctivae and EOM are normal. Pupils are equal, round, and reactive to light.  Neck: Normal range of motion. Neck supple.  Cardiovascular: Normal rate, regular rhythm and normal heart sounds.   Pulmonary/Chest: Effort normal and breath sounds normal. No respiratory distress. She has no wheezes. She has no rales.  Lymphadenopathy:    She has no cervical adenopathy.  Neurological: She is alert and oriented to person, place, and time.  Psychiatric: She has a normal mood and affect. Her behavior is normal. Thought content normal.  Vitals reviewed.         Assessment & Plan:  Allergy triggered sinusitis- new.  Pt's sxs are consistent w/ viral/allergy sinusitis.  No evidence of bacterial infxn.  No need for abx.  Start flonase and antihistamine daily.  Reviewed supportive care and red flags that should prompt return.  Pt expressed  understanding and is in agreement w/ plan.

## 2016-11-04 NOTE — Patient Instructions (Signed)
Follow up as needed Start daily Claritin or Zyrtec to improve your allergy congestion Use the Flonase- 2 sprays each nostril daily Drink plenty of fluids Tylenol or ibuprofen for pain, body aches or fevers REST!! Call with any questions or concerns Hang in there!!!

## 2016-11-04 NOTE — Progress Notes (Signed)
Pre visit review using our clinic review tool, if applicable. No additional management support is needed unless otherwise documented below in the visit note. 

## 2016-11-14 ENCOUNTER — Encounter: Payer: Self-pay | Admitting: General Practice

## 2016-11-14 LAB — HM PAP SMEAR

## 2016-12-03 ENCOUNTER — Other Ambulatory Visit: Payer: Self-pay | Admitting: Obstetrics and Gynecology

## 2017-02-14 ENCOUNTER — Encounter: Payer: Self-pay | Admitting: Family Medicine

## 2017-02-14 ENCOUNTER — Ambulatory Visit (INDEPENDENT_AMBULATORY_CARE_PROVIDER_SITE_OTHER): Payer: BC Managed Care – PPO | Admitting: Family Medicine

## 2017-02-14 VITALS — BP 130/81 | HR 64 | Temp 98.1°F | Resp 16 | Ht 64.0 in | Wt 229.2 lb

## 2017-02-14 DIAGNOSIS — Z0000 Encounter for general adult medical examination without abnormal findings: Secondary | ICD-10-CM

## 2017-02-14 DIAGNOSIS — Z23 Encounter for immunization: Secondary | ICD-10-CM

## 2017-02-14 LAB — HEPATIC FUNCTION PANEL
ALK PHOS: 83 U/L (ref 39–117)
ALT: 15 U/L (ref 0–35)
AST: 14 U/L (ref 0–37)
Albumin: 4 g/dL (ref 3.5–5.2)
BILIRUBIN DIRECT: 0.1 mg/dL (ref 0.0–0.3)
Total Bilirubin: 0.3 mg/dL (ref 0.2–1.2)
Total Protein: 7.4 g/dL (ref 6.0–8.3)

## 2017-02-14 LAB — LIPID PANEL
CHOL/HDL RATIO: 5
Cholesterol: 211 mg/dL — ABNORMAL HIGH (ref 0–200)
HDL: 43 mg/dL (ref >39.00)
LDL CALC: 145 mg/dL — AB (ref 0–99)
NONHDL: 168.21
Triglycerides: 116 mg/dL (ref 0.0–149.0)
VLDL: 23.2 mg/dL (ref 0.0–40.0)

## 2017-02-14 LAB — CBC WITH DIFFERENTIAL/PLATELET
BASOS ABS: 0.1 10*3/uL (ref 0.0–0.1)
BASOS PCT: 1.4 % (ref 0.0–3.0)
EOS ABS: 0.1 10*3/uL (ref 0.0–0.7)
Eosinophils Relative: 1.8 % (ref 0.0–5.0)
HEMATOCRIT: 37.5 % (ref 36.0–46.0)
HEMOGLOBIN: 12.4 g/dL (ref 12.0–15.0)
LYMPHS PCT: 42.4 % (ref 12.0–46.0)
Lymphs Abs: 2.1 10*3/uL (ref 0.7–4.0)
MCHC: 33 g/dL (ref 30.0–36.0)
MCV: 84.7 fl (ref 78.0–100.0)
MONOS PCT: 10.5 % (ref 3.0–12.0)
Monocytes Absolute: 0.5 10*3/uL (ref 0.1–1.0)
NEUTROS ABS: 2.2 10*3/uL (ref 1.4–7.7)
Neutrophils Relative %: 43.9 % (ref 43.0–77.0)
PLATELETS: 211 10*3/uL (ref 150.0–400.0)
RBC: 4.43 Mil/uL (ref 3.87–5.11)
RDW: 13.4 % (ref 11.5–15.5)
WBC: 4.9 10*3/uL (ref 4.0–10.5)

## 2017-02-14 LAB — BASIC METABOLIC PANEL
BUN: 11 mg/dL (ref 6–23)
CALCIUM: 9.3 mg/dL (ref 8.4–10.5)
CO2: 31 mEq/L (ref 19–32)
Chloride: 100 mEq/L (ref 96–112)
Creatinine, Ser: 0.89 mg/dL (ref 0.40–1.20)
GFR: 85.61 mL/min (ref >60.00)
Glucose, Bld: 108 mg/dL — ABNORMAL HIGH (ref 70–99)
POTASSIUM: 4.5 meq/L (ref 3.5–5.1)
SODIUM: 137 meq/L (ref 135–145)

## 2017-02-14 LAB — TSH: TSH: 0.93 u[IU]/mL (ref 0.35–4.50)

## 2017-02-14 LAB — VITAMIN D 25 HYDROXY (VIT D DEFICIENCY, FRACTURES): VITD: 17.93 ng/mL — ABNORMAL LOW (ref 30.00–100.00)

## 2017-02-14 NOTE — Patient Instructions (Signed)
Follow up in 6 months to recheck BP and cholesterol We'll notify you of your lab results and make any changes if needed Continue to work on healthy diet and regular exercise- you can do it! Call with any questions or concerns Happy Spring!!! 

## 2017-02-14 NOTE — Progress Notes (Signed)
   Subjective:    Patient ID: Nancy Spears, female    DOB: 03/14/1966, 51 y.o.   MRN: [id number]  HPI CPE- UTD on colonoscopy, pap, mammo (Dr Stefano GaulStringer).  Flu shot today.   Review of Systems Patient reports no vision/ hearing changes, adenopathy,fever, weight change,  persistant/recurrent hoarseness , swallowing issues, chest pain, palpitations, edema, persistant/recurrent cough, hemoptysis, dyspnea (rest/exertional/paroxysmal nocturnal), gastrointestinal bleeding (melena, rectal bleeding), abdominal pain, significant heartburn, bowel changes, GU symptoms (dysuria, hematuria, incontinence), Gyn symptoms (abnormal  bleeding, pain),  syncope, focal weakness, memory loss, numbness & tingling, skin/hair/nail changes, abnormal bruising or bleeding, anxiety, or depression.     Objective:   Physical Exam General Appearance:    Alert, cooperative, no distress, appears stated age, obese  Head:    Normocephalic, without obvious abnormality, atraumatic  Eyes:    PERRL, conjunctiva/corneas clear, EOM's intact, fundi    benign, both eyes  Ears:    Normal TM's and external ear canals, both ears  Nose:   Nares normal, septum midline, mucosa normal, no drainage    or sinus tenderness  Throat:   Lips, mucosa, and tongue normal; teeth and gums normal  Neck:   Supple, symmetrical, trachea midline, no adenopathy;    Thyroid: no enlargement/tenderness/nodules  Back:     Symmetric, no curvature, ROM normal, no CVA tenderness  Lungs:     Clear to auscultation bilaterally, respirations unlabored  Chest Wall:    No tenderness or deformity   Heart:    Regular rate and rhythm, S1 and S2 normal, no murmur, rub   or gallop  Breast Exam:    Deferred to GYN  Abdomen:     Soft, non-tender, bowel sounds active all four quadrants,    no masses, no organomegaly  Genitalia:    Deferred to GYN  Rectal:    Extremities:   Extremities normal, atraumatic, no cyanosis or edema  Pulses:   2+ and symmetric all extremities    Skin:   Skin color, texture, turgor normal, no rashes or lesions  Lymph nodes:   Cervical, supraclavicular, and axillary nodes normal  Neurologic:   CNII-XII intact, normal strength, sensation and reflexes    throughout          Assessment & Plan:

## 2017-02-14 NOTE — Assessment & Plan Note (Signed)
Pt's PE WNL w/ exception of obesity.  UTD on GYN, colonoscopy.  Flu shot today.  Check labs.  Anticipatory guidance provided.

## 2017-02-14 NOTE — Progress Notes (Signed)
Pre visit review using our clinic review tool, if applicable. No additional management support is needed unless otherwise documented below in the visit note. 

## 2017-02-17 ENCOUNTER — Telehealth: Payer: Self-pay | Admitting: Family Medicine

## 2017-02-17 NOTE — Telephone Encounter (Signed)
(  Voicemail received from phone at front desk on 02/17/17 at 2:25pm)  Returning call to office regarding lab results.

## 2017-02-18 ENCOUNTER — Other Ambulatory Visit: Payer: Self-pay | Admitting: General Practice

## 2017-02-18 ENCOUNTER — Other Ambulatory Visit: Payer: Self-pay | Admitting: Family Medicine

## 2017-02-18 DIAGNOSIS — E785 Hyperlipidemia, unspecified: Secondary | ICD-10-CM

## 2017-02-18 MED ORDER — ATORVASTATIN CALCIUM 20 MG PO TABS: 20.0000 mg | ORAL_TABLET | Freq: Every day | ORAL | 6 refills | Status: DC

## 2017-02-18 NOTE — Telephone Encounter (Signed)
[facility] Patient was given instructions to call for appointment

## 2017-03-24 ENCOUNTER — Other Ambulatory Visit: Payer: Self-pay | Admitting: Family Medicine

## 2017-08-19 ENCOUNTER — Ambulatory Visit (INDEPENDENT_AMBULATORY_CARE_PROVIDER_SITE_OTHER): Payer: BC Managed Care – PPO | Admitting: Family Medicine

## 2017-08-19 ENCOUNTER — Encounter: Payer: Self-pay | Admitting: Family Medicine

## 2017-08-19 VITALS — BP 131/86 | HR 80 | Temp 98.0°F | Resp 16 | Ht 64.0 in | Wt 229.0 lb

## 2017-08-19 DIAGNOSIS — Z23 Encounter for immunization: Secondary | ICD-10-CM

## 2017-08-19 DIAGNOSIS — I10 Essential (primary) hypertension: Secondary | ICD-10-CM

## 2017-08-19 DIAGNOSIS — E785 Hyperlipidemia, unspecified: Secondary | ICD-10-CM

## 2017-08-19 LAB — BASIC METABOLIC PANEL
BUN: 15 mg/dL (ref 6–23)
CHLORIDE: 99 meq/L (ref 96–112)
CO2: 31 meq/L (ref 19–32)
Calcium: 9.5 mg/dL (ref 8.4–10.5)
Creatinine, Ser: 1.01 mg/dL (ref 0.40–1.20)
GFR: 73.84 mL/min (ref >60.00)
GLUCOSE: 113 mg/dL — AB (ref 70–99)
Potassium: 4.3 mEq/L (ref 3.5–5.1)
SODIUM: 138 meq/L (ref 135–145)

## 2017-08-19 LAB — CBC WITH DIFFERENTIAL/PLATELET
Basophils Absolute: 0 10*3/uL (ref 0.0–0.1)
Basophils Relative: 0.5 % (ref 0.0–3.0)
EOS ABS: 0.1 10*3/uL (ref 0.0–0.7)
Eosinophils Relative: 1.5 % (ref 0.0–5.0)
HEMATOCRIT: 39.1 % (ref 36.0–46.0)
HEMOGLOBIN: 12.6 g/dL (ref 12.0–15.0)
LYMPHS ABS: 2 10*3/uL (ref 0.7–4.0)
Lymphocytes Relative: 34.3 % (ref 12.0–46.0)
MCHC: 32.3 g/dL (ref 30.0–36.0)
MCV: 85.6 fl (ref 78.0–100.0)
Monocytes Absolute: 0.6 10*3/uL (ref 0.1–1.0)
Monocytes Relative: 9.8 % (ref 3.0–12.0)
Neutro Abs: 3.2 10*3/uL (ref 1.4–7.7)
Neutrophils Relative %: 53.9 % (ref 43.0–77.0)
Platelets: 211 10*3/uL (ref 150.0–400.0)
RBC: 4.57 Mil/uL (ref 3.87–5.11)
RDW: 13.9 % (ref 11.5–15.5)
WBC: 5.9 10*3/uL (ref 4.0–10.5)

## 2017-08-19 LAB — LIPID PANEL
CHOLESTEROL: 194 mg/dL (ref 0–200)
HDL: 48 mg/dL (ref >39.00)
LDL Cholesterol: 130 mg/dL — ABNORMAL HIGH (ref 0–99)
NONHDL: 145.83
Total CHOL/HDL Ratio: 4
Triglycerides: 81 mg/dL (ref 0.0–149.0)
VLDL: 16.2 mg/dL (ref 0.0–40.0)

## 2017-08-19 LAB — HEPATIC FUNCTION PANEL
ALBUMIN: 4 g/dL (ref 3.5–5.2)
ALK PHOS: 93 U/L (ref 39–117)
ALT: 14 U/L (ref 0–35)
AST: 14 U/L (ref 0–37)
BILIRUBIN DIRECT: 0.1 mg/dL (ref 0.0–0.3)
TOTAL PROTEIN: 7.5 g/dL (ref 6.0–8.3)
Total Bilirubin: 0.3 mg/dL (ref 0.2–1.2)

## 2017-08-19 NOTE — Assessment & Plan Note (Signed)
Chronic problem.  Tolerating statin w/o difficulty.  Stressed need for healthy diet and regular exercise.  Check labs.  Adjust meds prn  

## 2017-08-19 NOTE — Assessment & Plan Note (Signed)
Chronic problem.  Adequate control today.  Asymptomatic.  Check labs.  No anticipated med changes.  Will follow. 

## 2017-08-19 NOTE — Patient Instructions (Signed)
Schedule your complete physical in 6 months We'll notify you of your lab results and make any changes if needed Continue to work on healthy diet and regular exercise- you can do it!!! Make sure you take some 'you' time!  You've earned it! Call with any questions or concerns Hang in there!!!

## 2017-08-19 NOTE — Progress Notes (Signed)
   Subjective:    Patient ID: Nancy Spears, female    DOB: 10/13/1966, 51 y.o.   MRN: [id number]  HPI HTN- chronic problem, on HCTZ daily w/ adequate control.  + fatigue.  No CP, SOB, HAs, visual changes, edema.  Hyperlipidemia- chronic problem, on Lipitor 20mg  daily.  No abd pain, N/V, myalgias.   Review of Systems For ROS see HPI     Objective:   Physical Exam  Constitutional: She is oriented to person, place, and time. She appears well-developed and well-nourished. No distress.  obese  HENT:  Head: Normocephalic and atraumatic.  Eyes: Pupils are equal, round, and reactive to light. Conjunctivae and EOM are normal.  Neck: Normal range of motion. Neck supple. No thyromegaly present.  Cardiovascular: Normal rate, regular rhythm, normal heart sounds and intact distal pulses.   No murmur heard. Pulmonary/Chest: Effort normal and breath sounds normal. No respiratory distress.  Abdominal: Soft. She exhibits no distension. There is no tenderness.  Musculoskeletal: She exhibits no edema.  Lymphadenopathy:    She has no cervical adenopathy.  Neurological: She is alert and oriented to person, place, and time.  Skin: Skin is warm and dry.  Psychiatric: She has a normal mood and affect. Her behavior is normal.  Vitals reviewed.         Assessment & Plan:

## 2017-08-19 NOTE — Progress Notes (Signed)
Pre visit review using our clinic review tool, if applicable. No additional management support is needed unless otherwise documented below in the visit note. 

## 2017-08-20 ENCOUNTER — Other Ambulatory Visit (INDEPENDENT_AMBULATORY_CARE_PROVIDER_SITE_OTHER): Payer: BC Managed Care – PPO

## 2017-08-20 DIAGNOSIS — R7309 Other abnormal glucose: Secondary | ICD-10-CM | POA: Diagnosis not present

## 2017-08-20 LAB — HEMOGLOBIN A1C: Hgb A1c MFr Bld: 6.7 % — ABNORMAL HIGH (ref 4.6–6.5)

## 2017-09-19 LAB — HM DIABETES EYE EXAM

## 2017-09-22 ENCOUNTER — Other Ambulatory Visit: Payer: Self-pay | Admitting: Family Medicine

## 2017-11-19 ENCOUNTER — Encounter: Payer: Self-pay | Admitting: Family Medicine

## 2017-11-19 ENCOUNTER — Ambulatory Visit: Payer: BC Managed Care – PPO | Admitting: Family Medicine

## 2017-11-19 ENCOUNTER — Other Ambulatory Visit: Payer: Self-pay

## 2017-11-19 DIAGNOSIS — E119 Type 2 diabetes mellitus without complications: Secondary | ICD-10-CM | POA: Diagnosis not present

## 2017-11-19 LAB — MICROALBUMIN / CREATININE URINE RATIO
Creatinine,U: 131.9 mg/dL
Microalb Creat Ratio: 0.5 mg/g (ref 0.0–30.0)
Microalb, Ur: <0.7 mg/dL (ref 0.0–1.9)

## 2017-11-19 LAB — LIPID PANEL
CHOL/HDL RATIO: 4
CHOLESTEROL: 211 mg/dL — AB (ref 0–200)
HDL: 49.1 mg/dL (ref >39.00)
LDL Cholesterol: 143 mg/dL — ABNORMAL HIGH (ref 0–99)
NonHDL: 162.21
TRIGLYCERIDES: 98 mg/dL (ref 0.0–149.0)
VLDL: 19.6 mg/dL (ref 0.0–40.0)

## 2017-11-19 LAB — HEPATIC FUNCTION PANEL
ALBUMIN: 4.2 g/dL (ref 3.5–5.2)
ALK PHOS: 81 U/L (ref 39–117)
ALT: 12 U/L (ref 0–35)
AST: 14 U/L (ref 0–37)
BILIRUBIN DIRECT: 0.1 mg/dL (ref 0.0–0.3)
Total Bilirubin: 0.4 mg/dL (ref 0.2–1.2)
Total Protein: 7.5 g/dL (ref 6.0–8.3)

## 2017-11-19 LAB — BASIC METABOLIC PANEL
BUN: 13 mg/dL (ref 6–23)
CHLORIDE: 101 meq/L (ref 96–112)
CO2: 31 mEq/L (ref 19–32)
Calcium: 9.2 mg/dL (ref 8.4–10.5)
Creatinine, Ser: 0.89 mg/dL (ref 0.40–1.20)
GFR: 85.36 mL/min (ref >60.00)
GLUCOSE: 108 mg/dL — AB (ref 70–99)
POTASSIUM: 4.4 meq/L (ref 3.5–5.1)
SODIUM: 139 meq/L (ref 135–145)

## 2017-11-19 LAB — HEMOGLOBIN A1C: Hgb A1c MFr Bld: 6.5 % (ref 4.6–6.5)

## 2017-11-19 LAB — TSH: TSH: 1.29 u[IU]/mL (ref 0.35–4.50)

## 2017-11-19 NOTE — Assessment & Plan Note (Signed)
New dx in September.  Pt's A1C was 6.7.  No meed for medication at that level.  Reviewed need for yearly eye exams (pt is seen twice yearly), yearly foot exams (done today), and yearly microablumin (ordered today).  Reviewed low carb diet w/ pt.  Stressed need for regular exercise.  Information provided to pt on both.  Suspect she will need to restart her statin- which she has stopped since last visit.  Check labs.  Adjust meds prn

## 2017-11-19 NOTE — Progress Notes (Signed)
   Subjective:    Patient ID: Darien Ramusudrey H [place]field, female    DOB: 03/11/1966, 51 y.o.   MRN: [id number]  HPI DM- new dx at last visit.  A1C 6.7  Pt is due for foot exam, microalbumin.  Pt was very surprised by her dx.  UTD on eye exam- seen twice yearly (April and Oct).  No CP, SOB, HAs, visual changes, numbness/tingling of hands/feet.  No symptomatic lows.   Review of Systems For ROS see HPI     Objective:   Physical Exam  Constitutional: She is oriented to person, place, and time. She appears well-developed and well-nourished. No distress.  HENT:  Head: Normocephalic and atraumatic.  Eyes: Conjunctivae and EOM are normal. Pupils are equal, round, and reactive to light.  Neck: Normal range of motion. Neck supple. No thyromegaly present.  Cardiovascular: Normal rate, regular rhythm, normal heart sounds and intact distal pulses.  No murmur heard. Pulmonary/Chest: Effort normal and breath sounds normal. No respiratory distress.  Abdominal: Soft. She exhibits no distension. There is no tenderness.  Musculoskeletal: She exhibits no edema.  Lymphadenopathy:    She has no cervical adenopathy.  Neurological: She is alert and oriented to person, place, and time.  Skin: Skin is warm and dry.  Psychiatric: She has a normal mood and affect. Her behavior is normal.  Vitals reviewed.         Assessment & Plan:

## 2017-11-19 NOTE — Patient Instructions (Addendum)
Follow up in 3-4 months to recheck sugar We'll notify you of your lab results and make any changes if needed Continue to work on healthy diet and regular exercise- you can do it! Review the information on diabetes and let me know if you have questions Please have your eye doctor send me copies of their notes Call with any questions or concerns Have a great winter!!!

## 2017-12-22 ENCOUNTER — Other Ambulatory Visit: Payer: Self-pay | Admitting: Family Medicine

## 2018-01-07 LAB — HM PAP SMEAR

## 2018-01-07 LAB — HM MAMMOGRAPHY: HM MAMMO: NORMAL (ref 0–4)

## 2018-01-13 ENCOUNTER — Encounter: Payer: Self-pay | Admitting: General Practice

## 2018-02-20 ENCOUNTER — Other Ambulatory Visit: Payer: Self-pay

## 2018-02-20 ENCOUNTER — Encounter: Payer: Self-pay | Admitting: Family Medicine

## 2018-02-20 ENCOUNTER — Ambulatory Visit (INDEPENDENT_AMBULATORY_CARE_PROVIDER_SITE_OTHER): Payer: BC Managed Care – PPO | Admitting: Family Medicine

## 2018-02-20 VITALS — BP 121/81 | HR 68 | Temp 98.1°F | Resp 16 | Ht 64.0 in | Wt 229.0 lb

## 2018-02-20 DIAGNOSIS — Z0000 Encounter for general adult medical examination without abnormal findings: Secondary | ICD-10-CM | POA: Diagnosis not present

## 2018-02-20 DIAGNOSIS — E559 Vitamin D deficiency, unspecified: Secondary | ICD-10-CM

## 2018-02-20 DIAGNOSIS — E119 Type 2 diabetes mellitus without complications: Secondary | ICD-10-CM

## 2018-02-20 LAB — CBC WITH DIFFERENTIAL/PLATELET
Basophils Absolute: 0 10*3/uL (ref 0.0–0.1)
Basophils Relative: 0.4 % (ref 0.0–3.0)
EOS PCT: 1.3 % (ref 0.0–5.0)
Eosinophils Absolute: 0.1 10*3/uL (ref 0.0–0.7)
HEMATOCRIT: 37 % (ref 36.0–46.0)
Hemoglobin: 12 g/dL (ref 12.0–15.0)
LYMPHS ABS: 1.9 10*3/uL (ref 0.7–4.0)
LYMPHS PCT: 33.7 % (ref 12.0–46.0)
MCHC: 32.5 g/dL (ref 30.0–36.0)
MCV: 85.2 fl (ref 78.0–100.0)
MONOS PCT: 8.8 % (ref 3.0–12.0)
Monocytes Absolute: 0.5 10*3/uL (ref 0.1–1.0)
NEUTROS ABS: 3.2 10*3/uL (ref 1.4–7.7)
NEUTROS PCT: 55.8 % (ref 43.0–77.0)
PLATELETS: 203 10*3/uL (ref 150.0–400.0)
RBC: 4.35 Mil/uL (ref 3.87–5.11)
RDW: 13.8 % (ref 11.5–15.5)
WBC: 5.6 10*3/uL (ref 4.0–10.5)

## 2018-02-20 LAB — TSH: TSH: 1.43 u[IU]/mL (ref 0.35–4.50)

## 2018-02-20 LAB — HEPATIC FUNCTION PANEL
ALK PHOS: 97 U/L (ref 39–117)
ALT: 18 U/L (ref 0–35)
AST: 16 U/L (ref 0–37)
Albumin: 3.8 g/dL (ref 3.5–5.2)
BILIRUBIN DIRECT: 0 mg/dL (ref 0.0–0.3)
BILIRUBIN TOTAL: 0.3 mg/dL (ref 0.2–1.2)
Total Protein: 7.3 g/dL (ref 6.0–8.3)

## 2018-02-20 LAB — BASIC METABOLIC PANEL
BUN: 13 mg/dL (ref 6–23)
CO2: 31 mEq/L (ref 19–32)
Calcium: 9.4 mg/dL (ref 8.4–10.5)
Chloride: 101 mEq/L (ref 96–112)
Creatinine, Ser: 0.96 mg/dL (ref 0.40–1.20)
GFR: 78.14 mL/min (ref >60.00)
GLUCOSE: 115 mg/dL — AB (ref 70–99)
POTASSIUM: 4 meq/L (ref 3.5–5.1)
SODIUM: 139 meq/L (ref 135–145)

## 2018-02-20 LAB — LIPID PANEL
CHOL/HDL RATIO: 3
CHOLESTEROL: 160 mg/dL (ref 0–200)
HDL: 48.5 mg/dL (ref >39.00)
LDL CALC: 98 mg/dL (ref 0–99)
NONHDL: 111.51
Triglycerides: 69 mg/dL (ref 0.0–149.0)
VLDL: 13.8 mg/dL (ref 0.0–40.0)

## 2018-02-20 LAB — HEMOGLOBIN A1C: Hgb A1c MFr Bld: 6.5 % (ref 4.6–6.5)

## 2018-02-20 LAB — VITAMIN D 25 HYDROXY (VIT D DEFICIENCY, FRACTURES): VITD: 20.19 ng/mL — ABNORMAL LOW (ref 30.00–100.00)

## 2018-02-20 NOTE — Assessment & Plan Note (Signed)
Pt's PE unchanged from previous.  UTD on pap, mammo, colonoscopy, and immunizations.  Check labs.  Anticipatory guidance provided.

## 2018-02-20 NOTE — Patient Instructions (Signed)
Follow up in 3-4 months to recheck diabetes We'll notify you of your lab results and make any changes if needed Continue to work on healthy diet and regular exercise- you can do it! Call with any questions or concerns Have a great weekend!!!

## 2018-02-20 NOTE — Assessment & Plan Note (Signed)
Pt's BMI is 39.3 and with her dxs of DM and HTN, she qualifies as morbidly obese.  Check labs to risk stratify.  Stressed need for healthy diet and regular exercise.  Will follow.

## 2018-02-20 NOTE — Assessment & Plan Note (Signed)
Chronic problem.  Attempting to control w/ diet and exercise.  UTD on foot exam, eye exam, and microalbumin.  Stressed need for healthy diet and regular exercise.  Check labs.  Start meds prn.

## 2018-02-20 NOTE — Progress Notes (Signed)
   Subjective:    Patient ID: Nancy Spears, female    DOB: 08/28/1966, 52 y.o.   MRN: [id number]  HPI CPE- UTD on pap, mammo, colonoscopy.   Review of Systems Patient reports no vision/ hearing changes, adenopathy,fever, weight change,  persistant/recurrent hoarseness , swallowing issues, chest pain, palpitations, edema, persistant/recurrent cough, hemoptysis, dyspnea (rest/exertional/paroxysmal nocturnal), gastrointestinal bleeding (melena, rectal bleeding), abdominal pain, significant heartburn, bowel changes, GU symptoms (dysuria, hematuria, incontinence), Gyn symptoms (abnormal  bleeding, pain),  syncope, focal weakness, memory loss, numbness & tingling, skin/hair/nail changes, abnormal bruising or bleeding, anxiety, or depression.     Objective:   Physical Exam General Appearance:    Alert, cooperative, no distress, appears stated age, obese  Head:    Normocephalic, without obvious abnormality, atraumatic  Eyes:    PERRL, conjunctiva/corneas clear, EOM's intact, fundi    benign, both eyes  Ears:    Normal TM's and external ear canals, both ears  Nose:   Nares normal, septum midline, mucosa normal, no drainage    or sinus tenderness  Throat:   Lips, mucosa, and tongue normal; teeth and gums normal  Neck:   Supple, symmetrical, trachea midline, no adenopathy;    Thyroid: no enlargement/tenderness/nodules  Back:     Symmetric, no curvature, ROM normal, no CVA tenderness  Lungs:     Clear to auscultation bilaterally, respirations unlabored  Chest Wall:    No tenderness or deformity   Heart:    Regular rate and rhythm, S1 and S2 normal, no murmur, rub   or gallop  Breast Exam:    Deferred to GYN  Abdomen:     Soft, non-tender, bowel sounds active all four quadrants,    no masses, no organomegaly  Genitalia:    Deferred to GYN  Rectal:    Extremities:   Extremities normal, atraumatic, no cyanosis or edema  Pulses:   2+ and symmetric all extremities  Skin:   Skin color, texture,  turgor normal, no rashes or lesions  Lymph nodes:   Cervical, supraclavicular, and axillary nodes normal  Neurologic:   CNII-XII intact, normal strength, sensation and reflexes    throughout          Assessment & Plan:

## 2018-02-23 ENCOUNTER — Other Ambulatory Visit: Payer: Self-pay | Admitting: General Practice

## 2018-02-23 MED ORDER — VITAMIN D (ERGOCALCIFEROL) 1.25 MG (50000 UNIT) PO CAPS: 50000.0000 [IU] | ORAL_CAPSULE | ORAL | 0 refills | Status: DC

## 2018-03-19 ENCOUNTER — Other Ambulatory Visit: Payer: Self-pay | Admitting: Obstetrics and Gynecology

## 2018-06-15 ENCOUNTER — Other Ambulatory Visit: Payer: Self-pay | Admitting: Family Medicine

## 2018-06-24 ENCOUNTER — Encounter: Payer: Self-pay | Admitting: Family Medicine

## 2018-06-24 ENCOUNTER — Ambulatory Visit: Payer: BC Managed Care – PPO | Admitting: Family Medicine

## 2018-06-24 ENCOUNTER — Other Ambulatory Visit: Payer: Self-pay

## 2018-06-24 VITALS — BP 120/80 | HR 76 | Temp 97.9°F | Resp 16 | Ht 64.0 in | Wt 225.5 lb

## 2018-06-24 DIAGNOSIS — E119 Type 2 diabetes mellitus without complications: Secondary | ICD-10-CM | POA: Diagnosis not present

## 2018-06-24 LAB — BASIC METABOLIC PANEL
BUN: 12 mg/dL (ref 6–23)
CALCIUM: 9.4 mg/dL (ref 8.4–10.5)
CO2: 33 meq/L — AB (ref 19–32)
CREATININE: 1.08 mg/dL (ref 0.40–1.20)
Chloride: 100 mEq/L (ref 96–112)
GFR: 68.12 mL/min (ref >60.00)
Glucose, Bld: 120 mg/dL — ABNORMAL HIGH (ref 70–99)
Potassium: 4.2 mEq/L (ref 3.5–5.1)
Sodium: 139 mEq/L (ref 135–145)

## 2018-06-24 LAB — HEMOGLOBIN A1C: HEMOGLOBIN A1C: 6.5 % (ref 4.6–6.5)

## 2018-06-24 NOTE — Assessment & Plan Note (Signed)
Chronic problem.  Diet controlled.  UTD on foot exam, eye exam, microalbumin.  Check labs.  Adjust tx plan prn.

## 2018-06-24 NOTE — Progress Notes (Signed)
   Subjective:    Patient ID: Nancy Spears, female    DOB: 07/28/1966, 52 y.o.   MRN: [id number]  HPI DM- chronic problem.  Diet controlled.  UTD on foot exam, eye exam.  UTD on microalbumin.  Pt has lost 4 lbs since last visit.  Pt is exercising sporadically.  No CP, SOB, HAs, visual changes, abd pain, N/V, edema.  No numbness/tingling of hands/feet.   Review of Systems For ROS see HPI     Objective:   Physical Exam  Constitutional: She is oriented to person, place, and time. She appears well-developed and well-nourished. No distress.  obesity  HENT:  Head: Normocephalic and atraumatic.  Eyes: Pupils are equal, round, and reactive to light. Conjunctivae and EOM are normal.  Neck: Normal range of motion. Neck supple. No thyromegaly present.  Cardiovascular: Normal rate, regular rhythm, normal heart sounds and intact distal pulses.  No murmur heard. Pulmonary/Chest: Effort normal and breath sounds normal. No respiratory distress.  Abdominal: Soft. She exhibits no distension. There is no tenderness.  Musculoskeletal: She exhibits no edema.  Lymphadenopathy:    She has no cervical adenopathy.  Neurological: She is alert and oriented to person, place, and time.  Skin: Skin is warm and dry.  Psychiatric: She has a normal mood and affect. Her behavior is normal.  Vitals reviewed.         Assessment & Plan:

## 2018-06-24 NOTE — Patient Instructions (Signed)
Follow up in 3-4 months to recheck sugars We'll notify you of your lab results and make any changes if needed Continue to work on healthy diet and regular exercise- you can do it! Call with any questions or concerns Have a great summer!!

## 2018-06-25 ENCOUNTER — Encounter: Payer: Self-pay | Admitting: General Practice

## 2018-10-07 LAB — HM DIABETES EYE EXAM

## 2018-10-14 ENCOUNTER — Ambulatory Visit: Payer: BC Managed Care – PPO | Admitting: Family Medicine

## 2018-10-19 ENCOUNTER — Ambulatory Visit: Payer: BC Managed Care – PPO | Admitting: Family Medicine

## 2018-10-26 ENCOUNTER — Ambulatory Visit: Payer: BC Managed Care – PPO | Admitting: Family Medicine

## 2018-10-26 ENCOUNTER — Encounter: Payer: Self-pay | Admitting: Family Medicine

## 2018-10-26 ENCOUNTER — Other Ambulatory Visit: Payer: Self-pay

## 2018-10-26 VITALS — BP 123/80 | HR 72 | Temp 98.0°F | Resp 16 | Ht 64.0 in | Wt 232.2 lb

## 2018-10-26 DIAGNOSIS — Z23 Encounter for immunization: Secondary | ICD-10-CM

## 2018-10-26 DIAGNOSIS — E119 Type 2 diabetes mellitus without complications: Secondary | ICD-10-CM | POA: Diagnosis not present

## 2018-10-26 DIAGNOSIS — E785 Hyperlipidemia, unspecified: Secondary | ICD-10-CM | POA: Diagnosis not present

## 2018-10-26 DIAGNOSIS — I10 Essential (primary) hypertension: Secondary | ICD-10-CM

## 2018-10-26 LAB — LIPID PANEL
CHOL/HDL RATIO: 4
CHOLESTEROL: 214 mg/dL — AB (ref 0–200)
HDL: 47.6 mg/dL (ref >39.00)
LDL CALC: 146 mg/dL — AB (ref 0–99)
NonHDL: 166.11
TRIGLYCERIDES: 103 mg/dL (ref 0.0–149.0)
VLDL: 20.6 mg/dL (ref 0.0–40.0)

## 2018-10-26 LAB — BASIC METABOLIC PANEL
BUN: 18 mg/dL (ref 6–23)
CO2: 31 mEq/L (ref 19–32)
Calcium: 9.7 mg/dL (ref 8.4–10.5)
Chloride: 102 mEq/L (ref 96–112)
Creatinine, Ser: 1.03 mg/dL (ref 0.40–1.20)
GFR: 71.86 mL/min (ref >60.00)
GLUCOSE: 106 mg/dL — AB (ref 70–99)
POTASSIUM: 3.5 meq/L (ref 3.5–5.1)
Sodium: 140 mEq/L (ref 135–145)

## 2018-10-26 LAB — HEPATIC FUNCTION PANEL
ALBUMIN: 4.1 g/dL (ref 3.5–5.2)
ALK PHOS: 76 U/L (ref 39–117)
ALT: 16 U/L (ref 0–35)
AST: 18 U/L (ref 0–37)
Bilirubin, Direct: 0 mg/dL (ref 0.0–0.3)
TOTAL PROTEIN: 8.1 g/dL (ref 6.0–8.3)
Total Bilirubin: 0.3 mg/dL (ref 0.2–1.2)

## 2018-10-26 LAB — MICROALBUMIN / CREATININE URINE RATIO
Creatinine,U: 123.9 mg/dL
Microalb Creat Ratio: 0.6 mg/g (ref 0.0–30.0)
Microalb, Ur: <0.7 mg/dL (ref 0.0–1.9)

## 2018-10-26 LAB — CBC WITH DIFFERENTIAL/PLATELET
BASOS PCT: 0.7 % (ref 0.0–3.0)
Basophils Absolute: 0 10*3/uL (ref 0.0–0.1)
EOS PCT: 2 % (ref 0.0–5.0)
Eosinophils Absolute: 0.1 10*3/uL (ref 0.0–0.7)
HEMATOCRIT: 38.3 % (ref 36.0–46.0)
HEMOGLOBIN: 12.5 g/dL (ref 12.0–15.0)
LYMPHS PCT: 41.7 % (ref 12.0–46.0)
Lymphs Abs: 2.4 10*3/uL (ref 0.7–4.0)
MCHC: 32.7 g/dL (ref 30.0–36.0)
MCV: 83.8 fl (ref 78.0–100.0)
MONO ABS: 0.7 10*3/uL (ref 0.1–1.0)
Monocytes Relative: 11.6 % (ref 3.0–12.0)
Neutro Abs: 2.5 10*3/uL (ref 1.4–7.7)
Neutrophils Relative %: 44 % (ref 43.0–77.0)
PLATELETS: 198 10*3/uL (ref 150.0–400.0)
RBC: 4.58 Mil/uL (ref 3.87–5.11)
RDW: 13.7 % (ref 11.5–15.5)
WBC: 5.7 10*3/uL (ref 4.0–10.5)

## 2018-10-26 LAB — HEMOGLOBIN A1C: Hgb A1c MFr Bld: 6.6 % — ABNORMAL HIGH (ref 4.6–6.5)

## 2018-10-26 LAB — TSH: TSH: 1.15 u[IU]/mL (ref 0.35–4.50)

## 2018-10-26 NOTE — Assessment & Plan Note (Signed)
Chronic problem.  Well controlled.  Asymptomatic.  Check labs.  No anticipated med changes.  Will follow. 

## 2018-10-26 NOTE — Patient Instructions (Signed)
Schedule your complete physical after 02/21/2019 We'll notify you of your lab results and make any changes if needed Continue to work on healthy diet and regular exercise- you can do it! Call with any questions or concerns Hang in there!!!

## 2018-10-26 NOTE — Progress Notes (Signed)
   Subjective:    Patient ID: Nancy Spears, female    DOB: 04-17-1966, 52 y.o.   MRN: [id number]  HPI DM- chronic problem.  Attempting to control w/ diet and exercise but pt has gained 6 lbs since last visit.  UTD on eye exam.  Due for foot exam and microalbumin next month.  No numbness/tingling of hands/feet.  No sores on feet.  HTN- chronic problem, on HCTZ daily w/ good control.  Denies CP, SOB, HAs, visual changes, edema.  Hyperlipidemia- chronic problem, on Lipitor 20mg  daily.  No abd pain, N/V.   Review of Systems For ROS see HPI     Objective:   Physical Exam  Constitutional: She is oriented to person, place, and time. She appears well-developed and well-nourished. No distress.  obese  HENT:  Head: Normocephalic and atraumatic.  Eyes: Pupils are equal, round, and reactive to light. Conjunctivae and EOM are normal.  Neck: Normal range of motion. Neck supple. No thyromegaly present.  Cardiovascular: Normal rate, regular rhythm, normal heart sounds and intact distal pulses.  No murmur heard. Pulmonary/Chest: Effort normal and breath sounds normal. No respiratory distress.  Abdominal: Soft. She exhibits no distension. There is no tenderness.  Musculoskeletal: She exhibits no edema.  Lymphadenopathy:    She has no cervical adenopathy.  Neurological: She is alert and oriented to person, place, and time.  Skin: Skin is warm and dry.  Psychiatric: She has a normal mood and affect. Her behavior is normal.  Vitals reviewed.         Assessment & Plan:

## 2018-10-26 NOTE — Assessment & Plan Note (Signed)
Chronic problem, tolerating statin w/o difficulty.  Encouraged healthy diet and regular exercise.  Check labs.  Adjust meds prn  

## 2018-10-26 NOTE — Assessment & Plan Note (Signed)
Chronic problem.  Currently controlled w/ diet and exercise but pt continues to gain weight.  UTD on eye exam.  Foot exam done today and microalbumin ordered.  Check labs.  Adjust tx prn.

## 2018-10-27 ENCOUNTER — Encounter: Payer: Self-pay | Admitting: General Practice

## 2018-11-09 ENCOUNTER — Other Ambulatory Visit: Payer: Self-pay | Admitting: Family Medicine

## 2019-01-06 LAB — HM MAMMOGRAPHY: HM Mammogram: NORMAL (ref 0–4)

## 2019-01-12 LAB — HM PAP SMEAR

## 2019-01-14 ENCOUNTER — Encounter: Payer: Self-pay | Admitting: General Practice

## 2019-02-09 ENCOUNTER — Other Ambulatory Visit: Payer: Self-pay | Admitting: Family Medicine

## 2019-02-09 MED ORDER — HYDROCHLOROTHIAZIDE 12.5 MG PO TABS: 12.5000 mg | ORAL_TABLET | Freq: Every day | ORAL | 0 refills | Status: DC

## 2019-03-25 ENCOUNTER — Encounter: Payer: BC Managed Care – PPO | Admitting: Family Medicine

## 2019-03-30 ENCOUNTER — Ambulatory Visit: Payer: BC Managed Care – PPO | Admitting: Family Medicine

## 2019-05-04 ENCOUNTER — Other Ambulatory Visit: Payer: Self-pay | Admitting: Family Medicine

## 2019-05-28 ENCOUNTER — Other Ambulatory Visit: Payer: Self-pay

## 2019-05-28 ENCOUNTER — Ambulatory Visit (INDEPENDENT_AMBULATORY_CARE_PROVIDER_SITE_OTHER): Payer: BC Managed Care – PPO | Admitting: Family Medicine

## 2019-05-28 ENCOUNTER — Encounter: Payer: Self-pay | Admitting: Family Medicine

## 2019-05-28 VITALS — BP 120/78 | HR 59 | Temp 97.1°F | Resp 14 | Ht 66.0 in | Wt 232.0 lb

## 2019-05-28 DIAGNOSIS — E559 Vitamin D deficiency, unspecified: Secondary | ICD-10-CM | POA: Diagnosis not present

## 2019-05-28 DIAGNOSIS — E119 Type 2 diabetes mellitus without complications: Secondary | ICD-10-CM | POA: Diagnosis not present

## 2019-05-28 LAB — CBC WITH DIFFERENTIAL/PLATELET
Basophils Absolute: 0 10*3/uL (ref 0.0–0.1)
Basophils Relative: 0.8 % (ref 0.0–3.0)
Eosinophils Absolute: 0.1 10*3/uL (ref 0.0–0.7)
Eosinophils Relative: 1.9 % (ref 0.0–5.0)
HCT: 39.5 % (ref 36.0–46.0)
Hemoglobin: 12.6 g/dL (ref 12.0–15.0)
Lymphocytes Relative: 38 % (ref 12.0–46.0)
Lymphs Abs: 2.1 10*3/uL (ref 0.7–4.0)
MCHC: 31.8 g/dL (ref 30.0–36.0)
MCV: 85.3 fl (ref 78.0–100.0)
Monocytes Absolute: 0.6 10*3/uL (ref 0.1–1.0)
Monocytes Relative: 10.5 % (ref 3.0–12.0)
Neutro Abs: 2.8 10*3/uL (ref 1.4–7.7)
Neutrophils Relative %: 48.8 % (ref 43.0–77.0)
Platelets: 216 10*3/uL (ref 150.0–400.0)
RBC: 4.63 Mil/uL (ref 3.87–5.11)
RDW: 13.9 % (ref 11.5–15.5)
WBC: 5.7 10*3/uL (ref 4.0–10.5)

## 2019-05-28 LAB — BASIC METABOLIC PANEL
BUN: 14 mg/dL (ref 6–23)
CO2: 29 mEq/L (ref 19–32)
Calcium: 9.6 mg/dL (ref 8.4–10.5)
Chloride: 101 mEq/L (ref 96–112)
Creatinine, Ser: 0.97 mg/dL (ref 0.40–1.20)
GFR: 72.3 mL/min (ref >60.00)
Glucose, Bld: 96 mg/dL (ref 70–99)
Potassium: 4.2 mEq/L (ref 3.5–5.1)
Sodium: 140 mEq/L (ref 135–145)

## 2019-05-28 LAB — HEMOGLOBIN A1C: Hgb A1c MFr Bld: 7.2 % — ABNORMAL HIGH (ref 4.6–6.5)

## 2019-05-28 LAB — LIPID PANEL
Cholesterol: 185 mg/dL (ref 0–200)
HDL: 48.1 mg/dL (ref >39.00)
LDL Cholesterol: 121 mg/dL — ABNORMAL HIGH (ref 0–99)
NonHDL: 136.74
Total CHOL/HDL Ratio: 4
Triglycerides: 78 mg/dL (ref 0.0–149.0)
VLDL: 15.6 mg/dL (ref 0.0–40.0)

## 2019-05-28 LAB — TSH: TSH: 1.06 u[IU]/mL (ref 0.35–4.50)

## 2019-05-28 LAB — HEPATIC FUNCTION PANEL
ALT: 15 U/L (ref 0–35)
AST: 16 U/L (ref 0–37)
Albumin: 4.1 g/dL (ref 3.5–5.2)
Alkaline Phosphatase: 94 U/L (ref 39–117)
Bilirubin, Direct: 0.1 mg/dL (ref 0.0–0.3)
Total Bilirubin: 0.4 mg/dL (ref 0.2–1.2)
Total Protein: 7.4 g/dL (ref 6.0–8.3)

## 2019-05-28 LAB — VITAMIN D 25 HYDROXY (VIT D DEFICIENCY, FRACTURES): VITD: 34.7 ng/mL (ref 30.00–100.00)

## 2019-05-28 NOTE — Progress Notes (Signed)
   Subjective:    Patient ID: Nancy Spears, female    DOB: [birth year], 53 y.o.   MRN: [id number]  HPI CPE- UTD on pap, mammo, immunizations.  UTD on eye exam.   Review of Systems Patient reports no vision/ hearing changes, adenopathy,fever, weight change,  persistant/recurrent hoarseness , swallowing issues, chest pain, palpitations, edema, persistant/recurrent cough, hemoptysis, dyspnea (rest/exertional/paroxysmal nocturnal), gastrointestinal bleeding (melena, rectal bleeding), abdominal pain, significant heartburn, bowel changes, GU symptoms (dysuria, hematuria, incontinence), Gyn symptoms (abnormal  bleeding, pain),  syncope, focal weakness, memory loss, numbness & tingling, skin/hair/nail changes, abnormal bruising or bleeding, anxiety, or depression.     Objective:   Physical Exam General Appearance:    Alert, cooperative, no distress, appears stated age, obese  Head:    Normocephalic, without obvious abnormality, atraumatic  Eyes:    PERRL, conjunctiva/corneas clear, EOM's intact, fundi    benign, both eyes  Ears:    Normal TM's and external ear canals, both ears  Nose:   Nares normal, septum midline, mucosa normal, no drainage    or sinus tenderness  Throat:   Lips, mucosa, and tongue normal; teeth and gums normal  Neck:   Supple, symmetrical, trachea midline, no adenopathy;    Thyroid: no enlargement/tenderness/nodules  Back:     Symmetric, no curvature, ROM normal, no CVA tenderness  Lungs:     Clear to auscultation bilaterally, respirations unlabored  Chest Wall:    No tenderness or deformity   Heart:    Regular rate and rhythm, S1 and S2 normal, no murmur, rub   or gallop  Breast Exam:    Deferred to GYN  Abdomen:     Soft, non-tender, bowel sounds active all four quadrants,    no masses, no organomegaly  Genitalia:    Deferred to GYN  Rectal:    Extremities:   Extremities normal, atraumatic, no cyanosis or edema  Pulses:   2+ and symmetric all extremities  Skin:   Skin  color, texture, turgor normal, no rashes or lesions  Lymph nodes:   Cervical, supraclavicular, and axillary nodes normal  Neurologic:   CNII-XII intact, normal strength, sensation and reflexes    throughout          Assessment & Plan:

## 2019-05-28 NOTE — Assessment & Plan Note (Signed)
Chronic problem.  UTD on foot exam, eye exam.  Check labs.  Adjust meds prn

## 2019-05-28 NOTE — Assessment & Plan Note (Signed)
Ongoing issue for pt.  Stressed need for healthy diet and regular exercise.  Will follow. 

## 2019-05-28 NOTE — Assessment & Plan Note (Signed)
Pt has hx of this.  Check labs and replete prn. 

## 2019-05-28 NOTE — Patient Instructions (Addendum)
Follow up in 6 months to recheck BP, cholesterol, and diabetes We'll notify you of your lab results and make any changes if needed Continue to work on healthy diet and regular exercise- you can do it! Call with any questions or concerns Stay Safe!!!

## 2019-05-31 ENCOUNTER — Encounter: Payer: Self-pay | Admitting: General Practice

## 2019-07-27 ENCOUNTER — Other Ambulatory Visit: Payer: Self-pay | Admitting: Family Medicine

## 2019-08-04 ENCOUNTER — Other Ambulatory Visit: Payer: Self-pay | Admitting: Family Medicine

## 2019-09-25 LAB — HM DIABETES EYE EXAM

## 2019-11-25 ENCOUNTER — Ambulatory Visit (INDEPENDENT_AMBULATORY_CARE_PROVIDER_SITE_OTHER): Payer: BC Managed Care – PPO | Admitting: Family Medicine

## 2019-11-25 ENCOUNTER — Other Ambulatory Visit: Payer: Self-pay

## 2019-11-25 ENCOUNTER — Encounter: Payer: Self-pay | Admitting: Family Medicine

## 2019-11-25 DIAGNOSIS — E119 Type 2 diabetes mellitus without complications: Secondary | ICD-10-CM | POA: Diagnosis not present

## 2019-11-25 DIAGNOSIS — E785 Hyperlipidemia, unspecified: Secondary | ICD-10-CM | POA: Diagnosis not present

## 2019-11-25 DIAGNOSIS — I10 Essential (primary) hypertension: Secondary | ICD-10-CM | POA: Diagnosis not present

## 2019-11-25 NOTE — Progress Notes (Signed)
Virtual Visit via Video   I connected with patient on 11/25/19 at  8:30 AM EST by a video enabled telemedicine application and verified that I am speaking with the correct person using two identifiers.  Location patient: Home Location provider: [facility], Office Persons participating in the virtual visit: Patient, Provider, [place] (Jess B)  I discussed the limitations of evaluation and management by telemedicine and the availability of in person appointments. The patient expressed understanding and agreed to proceed.  Subjective:   HPI:   HTN- chronic problem, on HCTZ 12.5mg  daily.  Unable to get home BP today.  No CP, SOB, HAs, visual changes, edmea  Hyperlipidemia- chronic problem, was on Lipitor 20mg  nightly but pt stopped ~1 month ago.  No particular reason, just fell off the radar.  No abd pain, N/V  DM- chronic problem, attempting to control w/ healthy diet and regular exercise.  Not currently on medication.  Due for foot exam and microalbumin.  Last A1C 7.2  Denies feeling shaky or dizzy.  No numbness/tingling of hands/feet.  No sores on feet.  Was starting to walk but then tore her meniscus so exercise has been limited.  ROS:   See pertinent positives and negatives per HPI.  Patient Active Problem List   Diagnosis Date Noted  . Vitamin D deficiency 02/20/2018  . Diabetes mellitus type II, controlled, with no complications ([place]) 62/26/3335  . Edema 05/15/2013  . HTN (hypertension) 05/13/2013  . Routine general medical examination at a health care facility 12/03/2012  . Seasonal allergic rhinitis 05/06/2012  . Hyperlipidemia 05/31/2009  . OBSTRUCTIVE SLEEP APNEA 05/04/2009  . PES PLANUS 04/27/2009  . UNEQUAL LEG LENGTH 04/27/2009  . Morbid obesity ([place]) 04/20/2009  . GERD 04/20/2009  . KNEE PAIN, RIGHT 04/20/2009    Social History   Tobacco Use  . Smoking status: Never Smoker  . Smokeless tobacco: Never Used  Substance Use Topics  . Alcohol use: Yes   Alcohol/week: 0.0 standard drinks    Comment: once monthly    Current Outpatient Medications:  .  hydrochlorothiazide (HYDRODIURIL) 12.5 MG tablet, TAKE 1 TABLET BY MOUTH EVERY DAY, Disp: 90 tablet, Rfl: 0 .  loratadine (CLARITIN) 10 MG tablet, Claritin 10 mg tablet  Take 1 tablet every day by oral route., Disp: , Rfl:  .  timolol (TIMOPTIC) 0.5 % ophthalmic solution, , Disp: , Rfl:  .  TRAVATAN Z 0.004 % SOLN ophthalmic solution, , Disp: , Rfl:  .  Vitamin D, Ergocalciferol, 2000 units CAPS, Take by mouth., Disp: , Rfl:  .  atorvastatin (LIPITOR) 20 MG tablet, TAKE 1 TABLET BY MOUTH EVERY DAY (Patient not taking: Reported on 11/25/2019), Disp: 90 tablet, Rfl: 2  No Known Allergies  Objective:   There were no vitals taken for this visit. AAOx3, NAD NCAT, EOMI No obvious CN deficits Coloring WNL Pt is able to speak clearly, coherently without shortness of breath or increased work of breathing.  Thought process is linear.  Mood is appropriate.   Assessment and Plan:   HTN- chronic problem.  Not able to check home BP today.  Will check when she comes to the office for labs and flu shot.  No anticipated med changes, will follow.  Hyperlipidemia- chronic problem, was tolerating statin w/o difficulty but ~1 month ago pt stopped taking b/c she just got out of the routine.  Will check labs and determine if restarting medication is needed  DM- chronic problem, has been attempting to control w/ diet and  exercise.  Exercise has been limited due to torn meniscus.  UTD on eye exam.  Unable to do foot exam virtually.  Will get microalbumin and A1C when she comes for labs.  Will continue to follow and based on A1C will determine if medication is needed.   Neena Rhymes, MD 11/25/2019

## 2019-11-25 NOTE — Progress Notes (Signed)
I have discussed the procedure for the virtual visit with the patient who has given consent to proceed with assessment and treatment.   Pt unable to obtain vitals.   Nancy Spears L Sandee Bernath, CMA     

## 2019-11-26 ENCOUNTER — Other Ambulatory Visit: Payer: Self-pay

## 2019-11-26 ENCOUNTER — Ambulatory Visit (INDEPENDENT_AMBULATORY_CARE_PROVIDER_SITE_OTHER): Payer: BC Managed Care – PPO

## 2019-11-26 DIAGNOSIS — E785 Hyperlipidemia, unspecified: Secondary | ICD-10-CM

## 2019-11-26 DIAGNOSIS — I10 Essential (primary) hypertension: Secondary | ICD-10-CM

## 2019-11-26 DIAGNOSIS — Z23 Encounter for immunization: Secondary | ICD-10-CM | POA: Diagnosis not present

## 2019-11-26 DIAGNOSIS — E119 Type 2 diabetes mellitus without complications: Secondary | ICD-10-CM | POA: Diagnosis not present

## 2019-11-26 LAB — TSH: TSH: 1.08 u[IU]/mL (ref 0.35–4.50)

## 2019-11-26 LAB — BASIC METABOLIC PANEL
BUN: 15 mg/dL (ref 6–23)
CO2: 32 mEq/L (ref 19–32)
Calcium: 9.7 mg/dL (ref 8.4–10.5)
Chloride: 98 mEq/L (ref 96–112)
Creatinine, Ser: 0.93 mg/dL (ref 0.40–1.20)
GFR: 75.76 mL/min (ref >60.00)
Glucose, Bld: 114 mg/dL — ABNORMAL HIGH (ref 70–99)
Potassium: 4.3 mEq/L (ref 3.5–5.1)
Sodium: 137 mEq/L (ref 135–145)

## 2019-11-26 LAB — MICROALBUMIN / CREATININE URINE RATIO
Creatinine,U: 191.7 mg/dL
Microalb Creat Ratio: 0.6 mg/g (ref 0.0–30.0)
Microalb, Ur: 1.1 mg/dL (ref 0.0–1.9)

## 2019-11-26 LAB — LIPID PANEL
Cholesterol: 232 mg/dL — ABNORMAL HIGH (ref 0–200)
HDL: 48.7 mg/dL (ref >39.00)
LDL Cholesterol: 156 mg/dL — ABNORMAL HIGH (ref 0–99)
NonHDL: 183.45
Total CHOL/HDL Ratio: 5
Triglycerides: 139 mg/dL (ref 0.0–149.0)
VLDL: 27.8 mg/dL (ref 0.0–40.0)

## 2019-11-26 LAB — HEPATIC FUNCTION PANEL
ALT: 15 U/L (ref 0–35)
AST: 17 U/L (ref 0–37)
Albumin: 4.1 g/dL (ref 3.5–5.2)
Alkaline Phosphatase: 90 U/L (ref 39–117)
Bilirubin, Direct: 0.1 mg/dL (ref 0.0–0.3)
Total Bilirubin: 0.4 mg/dL (ref 0.2–1.2)
Total Protein: 7.7 g/dL (ref 6.0–8.3)

## 2019-11-26 LAB — CBC WITH DIFFERENTIAL/PLATELET
Basophils Absolute: 0.1 10*3/uL (ref 0.0–0.1)
Basophils Relative: 1.3 % (ref 0.0–3.0)
Eosinophils Absolute: 0.1 10*3/uL (ref 0.0–0.7)
Eosinophils Relative: 1.9 % (ref 0.0–5.0)
HCT: 38.6 % (ref 36.0–46.0)
Hemoglobin: 12.5 g/dL (ref 12.0–15.0)
Lymphocytes Relative: 36.1 % (ref 12.0–46.0)
Lymphs Abs: 2.2 10*3/uL (ref 0.7–4.0)
MCHC: 32.4 g/dL (ref 30.0–36.0)
MCV: 84.5 fl (ref 78.0–100.0)
Monocytes Absolute: 0.7 10*3/uL (ref 0.1–1.0)
Monocytes Relative: 11 % (ref 3.0–12.0)
Neutro Abs: 3.1 10*3/uL (ref 1.4–7.7)
Neutrophils Relative %: 49.7 % (ref 43.0–77.0)
Platelets: 219 10*3/uL (ref 150.0–400.0)
RBC: 4.56 Mil/uL (ref 3.87–5.11)
RDW: 14 % (ref 11.5–15.5)
WBC: 6.2 10*3/uL (ref 4.0–10.5)

## 2019-11-26 LAB — HEMOGLOBIN A1C: Hgb A1c MFr Bld: 7 % — ABNORMAL HIGH (ref 4.6–6.5)

## 2019-11-29 ENCOUNTER — Telehealth: Payer: Self-pay | Admitting: Family Medicine

## 2019-11-29 NOTE — Telephone Encounter (Signed)
LM asking pt to call back to schedule a 3-12mth reck DM from 11/25/2019.

## 2019-12-30 ENCOUNTER — Other Ambulatory Visit: Payer: Self-pay | Admitting: Family Medicine

## 2019-12-30 MED ORDER — HYDROCHLOROTHIAZIDE 12.5 MG PO TABS: 12.5000 mg | ORAL_TABLET | Freq: Every day | ORAL | 0 refills | Status: DC

## 2020-03-06 ENCOUNTER — Encounter: Payer: Self-pay | Admitting: Family Medicine

## 2020-03-06 ENCOUNTER — Other Ambulatory Visit: Payer: Self-pay

## 2020-03-06 ENCOUNTER — Ambulatory Visit: Payer: BC Managed Care – PPO | Admitting: Family Medicine

## 2020-03-06 VITALS — BP 121/81 | HR 54 | Temp 98.1°F | Resp 16 | Ht 66.0 in | Wt 230.0 lb

## 2020-03-06 DIAGNOSIS — E119 Type 2 diabetes mellitus without complications: Secondary | ICD-10-CM

## 2020-03-06 LAB — BASIC METABOLIC PANEL
BUN: 13 mg/dL (ref 6–23)
CO2: 31 mEq/L (ref 19–32)
Calcium: 9.7 mg/dL (ref 8.4–10.5)
Chloride: 98 mEq/L (ref 96–112)
Creatinine, Ser: 0.9 mg/dL (ref 0.40–1.20)
GFR: 78.6 mL/min (ref >60.00)
Glucose, Bld: 110 mg/dL — ABNORMAL HIGH (ref 70–99)
Potassium: 4.1 mEq/L (ref 3.5–5.1)
Sodium: 138 mEq/L (ref 135–145)

## 2020-03-06 LAB — HEMOGLOBIN A1C: Hgb A1c MFr Bld: 7.1 % — ABNORMAL HIGH (ref 4.6–6.5)

## 2020-03-06 NOTE — Assessment & Plan Note (Signed)
Pt is working on walking more regularly.  Applauded her efforts.  Will continue to follow.

## 2020-03-06 NOTE — Progress Notes (Signed)
   Subjective:    Patient ID: Nancy Spears, female    DOB: 09/08/1966, 54 y.o.   MRN: [id number]  HPI DM- ongoing issue for pt.  Attempting to control w/ diet and exercise.  UTD on eye exam.  Due for foot exam.  UTD on microalbumin.  Pt is 'working on my walking'.  No CP, SOB, HAs, visual changes, abd pain, N/V.  No numbness/tingling of hands/feet.  Working on low sugar diet.   Review of Systems For ROS see HPI   This visit occurred during the SARS-CoV-2 public health emergency.  Safety protocols were in place, including screening questions prior to the visit, additional usage of staff PPE, and extensive cleaning of exam room while observing appropriate contact time as indicated for disinfecting solutions.       Objective:   Physical Exam Vitals reviewed.  Constitutional:      General: She is not in acute distress.    Appearance: She is well-developed. She is obese.  HENT:     Head: Normocephalic and atraumatic.  Eyes:     Conjunctiva/sclera: Conjunctivae normal.     Pupils: Pupils are equal, round, and reactive to light.  Neck:     Thyroid: No thyromegaly.  Cardiovascular:     Rate and Rhythm: Normal rate and regular rhythm.     Heart sounds: Normal heart sounds. No murmur.  Pulmonary:     Effort: Pulmonary effort is normal. No respiratory distress.     Breath sounds: Normal breath sounds.  Abdominal:     General: There is no distension.     Palpations: Abdomen is soft.     Tenderness: There is no abdominal tenderness.  Musculoskeletal:     Cervical back: Normal range of motion and neck supple.  Lymphadenopathy:     Cervical: No cervical adenopathy.  Skin:    General: Skin is warm and dry.  Neurological:     Mental Status: She is alert and oriented to person, place, and time.  Psychiatric:        Behavior: Behavior normal.           Assessment & Plan:

## 2020-03-06 NOTE — Patient Instructions (Signed)
Schedule your complete physical in 3-4 months We'll notify you of your lab results and make any changes if needed Continue to work on healthy diet and regular exercise- you can do it! Call with any questions or concerns Stay Safe!  Stay Healthy! 

## 2020-03-06 NOTE — Assessment & Plan Note (Signed)
Chronic problem.  Attempting to control w/ diet and exercise.  UTD on eye exam, microalbumin.  Foot exam done today.  Check labs and determine if medication is needed.

## 2020-03-07 ENCOUNTER — Encounter: Payer: Self-pay | Admitting: General Practice

## 2020-03-16 ENCOUNTER — Ambulatory Visit: Payer: BC Managed Care – PPO | Attending: Family

## 2020-03-16 DIAGNOSIS — Z23 Encounter for immunization: Secondary | ICD-10-CM

## 2020-03-16 LAB — HM DIABETES EYE EXAM

## 2020-03-16 NOTE — Progress Notes (Signed)
   Covid-19 Vaccination Clinic  Name:  RYLEN HOU    MRN: [id number] DOB: 1966/05/01  03/16/2020  Ms. Rodriges was observed post Covid-19 immunization for 15 minutes without incident. She was provided with Vaccine Information Sheet and instruction to access the V-Safe system.   Ms. Mondesir was instructed to call 911 with any severe reactions post vaccine: Marland Kitchen Difficulty breathing  . Swelling of face and throat  . A fast heartbeat  . A bad rash all over body  . Dizziness and weakness   Immunizations Administered    Name Date Dose VIS Date Route   Moderna COVID-19 Vaccine 03/16/2020 10:53 AM 0.5 mL 11/16/2019 Intramuscular   Manufacturer: Moderna   Lot: [id number]   NDC: 92763-943-20

## 2020-04-18 ENCOUNTER — Ambulatory Visit: Payer: BC Managed Care – PPO | Attending: Family

## 2020-04-18 DIAGNOSIS — Z23 Encounter for immunization: Secondary | ICD-10-CM

## 2020-04-18 NOTE — Progress Notes (Signed)
   Covid-19 Vaccination Clinic  Name:  Nancy Spears    MRN: [id number] DOB: 10-19-1966  04/18/2020  Nancy Spears was observed post Covid-19 immunization for 15 minutes without incident. She was provided with Vaccine Information Sheet and instruction to access the V-Safe system.   Nancy Spears was instructed to call 911 with any severe reactions post vaccine: Marland Kitchen Difficulty breathing  . Swelling of face and throat  . A fast heartbeat  . A bad rash all over body  . Dizziness and weakness   Immunizations Administered    Name Date Dose VIS Date Route   Moderna COVID-19 Vaccine 04/18/2020 10:26 AM 0.5 mL 11/2019 Intramuscular   Manufacturer: Moderna   Lot: [id number]   NDC: 74081-448-18

## 2020-04-21 ENCOUNTER — Other Ambulatory Visit: Payer: Self-pay | Admitting: Family Medicine

## 2020-04-21 MED ORDER — HYDROCHLOROTHIAZIDE 12.5 MG PO TABS: 12.5000 mg | ORAL_TABLET | Freq: Every day | ORAL | 0 refills | Status: DC

## 2020-07-06 ENCOUNTER — Other Ambulatory Visit: Payer: Self-pay

## 2020-07-06 ENCOUNTER — Ambulatory Visit (INDEPENDENT_AMBULATORY_CARE_PROVIDER_SITE_OTHER): Payer: BC Managed Care – PPO | Admitting: Family Medicine

## 2020-07-06 ENCOUNTER — Encounter: Payer: Self-pay | Admitting: Family Medicine

## 2020-07-06 VITALS — BP 123/82 | HR 55 | Temp 97.6°F | Resp 16 | Ht 66.0 in | Wt 233.1 lb

## 2020-07-06 DIAGNOSIS — E119 Type 2 diabetes mellitus without complications: Secondary | ICD-10-CM | POA: Diagnosis not present

## 2020-07-06 DIAGNOSIS — E785 Hyperlipidemia, unspecified: Secondary | ICD-10-CM

## 2020-07-06 DIAGNOSIS — I10 Essential (primary) hypertension: Secondary | ICD-10-CM

## 2020-07-06 NOTE — Patient Instructions (Addendum)
Schedule your complete physical in 3-4 months We'll notify you of your lab results and make any changes if needed Continue to work on healthy diet and regular exercise- you can do it! Call with any questions or concerns Enjoy the rest of your time off!!!

## 2020-07-06 NOTE — Assessment & Plan Note (Signed)
Ongoing issue for pt.  BMI 37.63  Given her DM, HTN, hyperlipidemia this qualifies as morbidly obese.  Stressed need for healthy diet and regular exercise.  Will follow.

## 2020-07-06 NOTE — Progress Notes (Signed)
   Subjective:    Patient ID: Nancy Spears, female    DOB: 09/22/1966, 54 y.o.   MRN: [id number]  HPI HTN- chronic problem, on HCTZ 12.5mg  daily w/ good control.  No CP, SOB, HAs, visual changes, edema.  Hyperlipidemia- pt was prescribed Lipitor 20mg  daily but stopped taking 2 months ago.  No abd pain, N/V  DM- has been attempting to control w/ diet and exercise.  Last A1C 7.1  UTD on eye exam, foot exam, and microalbumin.  No numbness/tingling of hands/feet.  Obesity- pt has gained 3 lbs.  BMI is 37.63  Pt is sporadically exercising.  Attempting to follow low carb diet.   Review of Systems For ROS see HPI   This visit occurred during the SARS-CoV-2 public health emergency.  Safety protocols were in place, including screening questions prior to the visit, additional usage of staff PPE, and extensive cleaning of exam room while observing appropriate contact time as indicated for disinfecting solutions.       Objective:   Physical Exam Vitals reviewed.  Constitutional:      General: She is not in acute distress.    Appearance: She is well-developed. She is obese.  HENT:     Head: Normocephalic and atraumatic.  Eyes:     Conjunctiva/sclera: Conjunctivae normal.     Pupils: Pupils are equal, round, and reactive to light.  Neck:     Thyroid: No thyromegaly.  Cardiovascular:     Rate and Rhythm: Normal rate and regular rhythm.     Heart sounds: Normal heart sounds. No murmur heard.   Pulmonary:     Effort: Pulmonary effort is normal. No respiratory distress.     Breath sounds: Normal breath sounds.  Abdominal:     General: There is no distension.     Palpations: Abdomen is soft.     Tenderness: There is no abdominal tenderness.  Musculoskeletal:     Cervical back: Normal range of motion and neck supple.  Lymphadenopathy:     Cervical: No cervical adenopathy.  Skin:    General: Skin is warm and dry.  Neurological:     Mental Status: She is alert and oriented to person,  place, and time.  Psychiatric:        Behavior: Behavior normal.           Assessment & Plan:

## 2020-07-06 NOTE — Assessment & Plan Note (Signed)
Chronic problem.  Pt was taking Lipitor regularly but got out of the habit a few months ago.  Check labs and determine if a restart is needed

## 2020-07-06 NOTE — Assessment & Plan Note (Signed)
Pt is attempting to control w/ diet and exercise.  Admits exercise has been sporadic but she has been following a low carb diet.  UTD on foot exam, eye exam, microalbumin.  Check labs and start meds if needed.

## 2020-07-06 NOTE — Assessment & Plan Note (Signed)
Chronic problem.  Well controlled.  Asymptomatic.  Check labs.  No anticipated med changes.  Will follow. 

## 2020-07-06 NOTE — Addendum Note (Signed)
Addended by: Lana Fish on: 07/06/2020 11:31 AM   Modules accepted: Orders

## 2020-07-07 ENCOUNTER — Other Ambulatory Visit: Payer: Self-pay | Admitting: General Practice

## 2020-07-07 ENCOUNTER — Other Ambulatory Visit (INDEPENDENT_AMBULATORY_CARE_PROVIDER_SITE_OTHER): Payer: BC Managed Care – PPO

## 2020-07-07 DIAGNOSIS — I10 Essential (primary) hypertension: Secondary | ICD-10-CM | POA: Diagnosis not present

## 2020-07-07 DIAGNOSIS — E785 Hyperlipidemia, unspecified: Secondary | ICD-10-CM | POA: Diagnosis not present

## 2020-07-07 DIAGNOSIS — E119 Type 2 diabetes mellitus without complications: Secondary | ICD-10-CM

## 2020-07-07 LAB — BASIC METABOLIC PANEL
BUN: 13 mg/dL (ref 6–23)
CO2: 29 mEq/L (ref 19–32)
Calcium: 9.2 mg/dL (ref 8.4–10.5)
Chloride: 99 mEq/L (ref 96–112)
Creatinine, Ser: 1.02 mg/dL (ref 0.40–1.20)
GFR: 67.94 mL/min (ref >60.00)
Glucose, Bld: 141 mg/dL — ABNORMAL HIGH (ref 70–99)
Potassium: 3.6 mEq/L (ref 3.5–5.1)
Sodium: 135 mEq/L (ref 135–145)

## 2020-07-07 LAB — CBC WITH DIFFERENTIAL/PLATELET
Basophils Absolute: 0 10*3/uL (ref 0.0–0.1)
Basophils Relative: 0.5 % (ref 0.0–3.0)
Eosinophils Absolute: 0.1 10*3/uL (ref 0.0–0.7)
Eosinophils Relative: 2.1 % (ref 0.0–5.0)
HCT: 37.3 % (ref 36.0–46.0)
Hemoglobin: 12.3 g/dL (ref 12.0–15.0)
Lymphocytes Relative: 39 % (ref 12.0–46.0)
Lymphs Abs: 2.4 10*3/uL (ref 0.7–4.0)
MCHC: 32.9 g/dL (ref 30.0–36.0)
MCV: 84.2 fl (ref 78.0–100.0)
Monocytes Absolute: 0.6 10*3/uL (ref 0.1–1.0)
Monocytes Relative: 9.7 % (ref 3.0–12.0)
Neutro Abs: 3 10*3/uL (ref 1.4–7.7)
Neutrophils Relative %: 48.7 % (ref 43.0–77.0)
Platelets: 183 10*3/uL (ref 150.0–400.0)
RBC: 4.43 Mil/uL (ref 3.87–5.11)
RDW: 13.7 % (ref 11.5–15.5)
WBC: 6.2 10*3/uL (ref 4.0–10.5)

## 2020-07-07 LAB — HEPATIC FUNCTION PANEL
ALT: 18 U/L (ref 0–35)
AST: 10 U/L (ref 0–37)
Albumin: 3.9 g/dL (ref 3.5–5.2)
Alkaline Phosphatase: 91 U/L (ref 39–117)
Bilirubin, Direct: 0 mg/dL (ref 0.0–0.3)
Total Bilirubin: 0.3 mg/dL (ref 0.2–1.2)
Total Protein: 7.6 g/dL (ref 6.0–8.3)

## 2020-07-07 LAB — LIPID PANEL
Cholesterol: 209 mg/dL — ABNORMAL HIGH (ref 0–200)
HDL: 44.2 mg/dL (ref >39.00)
LDL Cholesterol: 141 mg/dL — ABNORMAL HIGH (ref 0–99)
NonHDL: 164.51
Total CHOL/HDL Ratio: 5
Triglycerides: 117 mg/dL (ref 0.0–149.0)
VLDL: 23.4 mg/dL (ref 0.0–40.0)

## 2020-07-07 LAB — TSH: TSH: 1.63 u[IU]/mL (ref 0.35–4.50)

## 2020-07-07 LAB — HEMOGLOBIN A1C: Hgb A1c MFr Bld: 7.4 % — ABNORMAL HIGH (ref 4.6–6.5)

## 2020-07-07 MED ORDER — ATORVASTATIN CALCIUM 20 MG PO TABS: 20.0000 mg | ORAL_TABLET | Freq: Every day | ORAL | 1 refills | Status: DC

## 2020-07-07 MED ORDER — METFORMIN HCL 500 MG PO TABS
500.0000 mg | ORAL_TABLET | Freq: Two times a day (BID) | ORAL | 1 refills | Status: DC
Start: 2020-07-07 — End: 2021-06-19

## 2020-10-10 ENCOUNTER — Other Ambulatory Visit: Payer: Self-pay | Admitting: Family Medicine

## 2020-10-12 ENCOUNTER — Ambulatory Visit (INDEPENDENT_AMBULATORY_CARE_PROVIDER_SITE_OTHER): Payer: BC Managed Care – PPO | Admitting: Family Medicine

## 2020-10-12 ENCOUNTER — Encounter: Payer: Self-pay | Admitting: Family Medicine

## 2020-10-12 ENCOUNTER — Other Ambulatory Visit: Payer: Self-pay

## 2020-10-12 ENCOUNTER — Other Ambulatory Visit (INDEPENDENT_AMBULATORY_CARE_PROVIDER_SITE_OTHER): Payer: BC Managed Care – PPO

## 2020-10-12 VITALS — BP 130/80 | HR 68 | Temp 97.4°F | Resp 16 | Ht 64.0 in | Wt 227.6 lb

## 2020-10-12 DIAGNOSIS — E119 Type 2 diabetes mellitus without complications: Secondary | ICD-10-CM

## 2020-10-12 DIAGNOSIS — E559 Vitamin D deficiency, unspecified: Secondary | ICD-10-CM

## 2020-10-12 DIAGNOSIS — Z0000 Encounter for general adult medical examination without abnormal findings: Secondary | ICD-10-CM | POA: Diagnosis not present

## 2020-10-12 DIAGNOSIS — Z23 Encounter for immunization: Secondary | ICD-10-CM

## 2020-10-12 DIAGNOSIS — Z1231 Encounter for screening mammogram for malignant neoplasm of breast: Secondary | ICD-10-CM | POA: Diagnosis not present

## 2020-10-12 LAB — BASIC METABOLIC PANEL
BUN: 15 mg/dL (ref 6–23)
CO2: 30 mEq/L (ref 19–32)
Calcium: 9.3 mg/dL (ref 8.4–10.5)
Chloride: 99 mEq/L (ref 96–112)
Creatinine, Ser: 1.01 mg/dL (ref 0.40–1.20)
GFR: 62.54 mL/min (ref >60.00)
Glucose, Bld: 107 mg/dL — ABNORMAL HIGH (ref 70–99)
Potassium: 3.5 mEq/L (ref 3.5–5.1)
Sodium: 137 mEq/L (ref 135–145)

## 2020-10-12 LAB — MICROALBUMIN / CREATININE URINE RATIO
Creatinine,U: 123 mg/dL
Microalb Creat Ratio: 0.6 mg/g (ref 0.0–30.0)
Microalb, Ur: <0.7 mg/dL (ref 0.0–1.9)

## 2020-10-12 LAB — CBC WITH DIFFERENTIAL/PLATELET
Basophils Absolute: 0 10*3/uL (ref 0.0–0.1)
Basophils Relative: 0.6 % (ref 0.0–3.0)
Eosinophils Absolute: 0.1 10*3/uL (ref 0.0–0.7)
Eosinophils Relative: 1.4 % (ref 0.0–5.0)
HCT: 38.5 % (ref 36.0–46.0)
Hemoglobin: 12.4 g/dL (ref 12.0–15.0)
Lymphocytes Relative: 38.9 % (ref 12.0–46.0)
Lymphs Abs: 2.8 10*3/uL (ref 0.7–4.0)
MCHC: 32.3 g/dL (ref 30.0–36.0)
MCV: 83.4 fl (ref 78.0–100.0)
Monocytes Absolute: 0.7 10*3/uL (ref 0.1–1.0)
Monocytes Relative: 9.8 % (ref 3.0–12.0)
Neutro Abs: 3.5 10*3/uL (ref 1.4–7.7)
Neutrophils Relative %: 49.3 % (ref 43.0–77.0)
Platelets: 202 10*3/uL (ref 150.0–400.0)
RBC: 4.62 Mil/uL (ref 3.87–5.11)
RDW: 13.7 % (ref 11.5–15.5)
WBC: 7.1 10*3/uL (ref 4.0–10.5)

## 2020-10-12 LAB — HEPATIC FUNCTION PANEL
ALT: 14 U/L (ref 0–35)
AST: 15 U/L (ref 0–37)
Albumin: 4 g/dL (ref 3.5–5.2)
Alkaline Phosphatase: 76 U/L (ref 39–117)
Bilirubin, Direct: 0.1 mg/dL (ref 0.0–0.3)
Total Bilirubin: 0.4 mg/dL (ref 0.2–1.2)
Total Protein: 7.4 g/dL (ref 6.0–8.3)

## 2020-10-12 LAB — LIPID PANEL
Cholesterol: 220 mg/dL — ABNORMAL HIGH (ref 0–200)
HDL: 48.3 mg/dL (ref >39.00)
LDL Cholesterol: 154 mg/dL — ABNORMAL HIGH (ref 0–99)
NonHDL: 172.07
Total CHOL/HDL Ratio: 5
Triglycerides: 91 mg/dL (ref 0.0–149.0)
VLDL: 18.2 mg/dL (ref 0.0–40.0)

## 2020-10-12 LAB — HEMOGLOBIN A1C: Hgb A1c MFr Bld: 7 % — ABNORMAL HIGH (ref 4.6–6.5)

## 2020-10-12 LAB — TSH: TSH: 1.1 u[IU]/mL (ref 0.35–4.50)

## 2020-10-12 LAB — VITAMIN D 25 HYDROXY (VIT D DEFICIENCY, FRACTURES): VITD: 25.21 ng/mL — ABNORMAL LOW (ref 30.00–100.00)

## 2020-10-12 MED ORDER — VITAMIN D (ERGOCALCIFEROL) 1.25 MG (50000 UNIT) PO CAPS: 50000.0000 [IU] | ORAL_CAPSULE | ORAL | 0 refills | Status: DC

## 2020-10-12 NOTE — Assessment & Plan Note (Signed)
Check labs and replete prn. 

## 2020-10-12 NOTE — Progress Notes (Signed)
   Subjective:    Patient ID: Nancy Spears, female    DOB: Jun 01, 1966, 54 y.o.   MRN: [id number]  HPI CPE- UTD on pap smear, colonoscopy.  Due for mammo.  UTD on eye exam- done in April 2021  Reviewed past medical, surgical, family and social histories.   Health Maintenance  Topic Date Due  . INFLUENZA VACCINE  07/16/2020  . OPHTHALMOLOGY EXAM  09/24/2020  . URINE MICROALBUMIN  11/25/2020  . HIV Screening  11/24/2020 (Originally 12/25/1979)  . PNEUMOCOCCAL POLYSACCHARIDE VACCINE AGE 37-64 HIGH RISK  07/06/2021 (Originally 12/24/1966)  . Hepatitis C Screening  07/06/2021 (Originally 07/15/1966)  . MAMMOGRAM  01/06/2021  . HEMOGLOBIN A1C  01/07/2021  . FOOT EXAM  03/06/2021  . PAP SMEAR-Modifier  01/12/2022  . TETANUS/TDAP  01/15/2024  . COLONOSCOPY  03/12/2025  . COVID-19 Vaccine  Completed    Patient Care Team    Relationship Specialty Notifications Start End  Sheliah Hatch, MD PCP - General   11/28/10   Kirkland Hun, MD Consulting Physician Obstetrics and Gynecology  02/12/16   Glenford Peers, OD Referring Physician Optometry  02/12/16   Merrily Pew, MD Consulting Physician Ophthalmology  02/12/16      Review of Systems Patient reports no vision/ hearing changes, adenopathy,fever, weight change,  persistant/recurrent hoarseness , swallowing issues, chest pain, palpitations, edema, persistant/recurrent cough, hemoptysis, dyspnea (rest/exertional/paroxysmal nocturnal), gastrointestinal bleeding (melena, rectal bleeding), abdominal pain, significant heartburn, bowel changes, GU symptoms (dysuria, hematuria, incontinence), Gyn symptoms (abnormal  bleeding, pain),  syncope, focal weakness, memory loss, numbness & tingling, skin/hair/nail changes, abnormal bruising or bleeding, anxiety, or depression.   This visit occurred during the SARS-CoV-2 public health emergency.  Safety protocols were in place, including screening questions prior to the visit, additional usage of staff PPE,  and extensive cleaning of exam room while observing appropriate contact time as indicated for disinfecting solutions.       Objective:   Physical Exam General Appearance:    Alert, cooperative, no distress, appears stated age, obese  Head:    Normocephalic, without obvious abnormality, atraumatic  Eyes:    PERRL, conjunctiva/corneas clear, EOM's intact, fundi    benign, both eyes  Ears:    Normal TM's and external ear canals, both ears  Nose:   Deferred due to COVID  Throat:   Neck:   Supple, symmetrical, trachea midline, no adenopathy;    Thyroid: no enlargement/tenderness/nodules  Back:     Symmetric, no curvature, ROM normal, no CVA tenderness  Lungs:     Clear to auscultation bilaterally, respirations unlabored  Chest Wall:    No tenderness or deformity   Heart:    Regular rate and rhythm, S1 and S2 normal, no murmur, rub   or gallop  Breast Exam:    Deferred to GYN  Abdomen:     Soft, non-tender, bowel sounds active all four quadrants,    no masses, no organomegaly  Genitalia:    Deferred to GYN  Rectal:    Extremities:   Extremities normal, atraumatic, no cyanosis or edema  Pulses:   2+ and symmetric all extremities  Skin:   Skin color, texture, turgor normal, no rashes or lesions  Lymph nodes:   Cervical, supraclavicular, and axillary nodes normal  Neurologic:   CNII-XII intact, normal strength, sensation and reflexes    throughout          Assessment & Plan:

## 2020-10-12 NOTE — Patient Instructions (Signed)
Follow up in 3-4 months to recheck sugar We'll notify you of your lab results and make any changes if needed Continue to work on healthy diet and regular exercise- you're doing great!! Call with any questions or concerns Stay Safe!  Stay Healthy!

## 2020-10-12 NOTE — Assessment & Plan Note (Signed)
Pt's PE WNL w/ exception of obesity.  UTD on pap, colonoscopy.  Due for mammo- ordered.  UTD on COVID, Tdap.  Flu shot given today.  Check labs.  Anticipatory guidance provided.

## 2020-10-12 NOTE — Assessment & Plan Note (Addendum)
Ongoing issue for pt.  She did not start Metformin despite last A1C of 7.4.  UTD on eye exam, foot exam.  UTD on microalbumin.  Check labs.  Adjust meds prn

## 2020-10-13 ENCOUNTER — Other Ambulatory Visit: Payer: Self-pay | Admitting: General Practice

## 2020-10-13 MED ORDER — ATORVASTATIN CALCIUM 20 MG PO TABS
20.0000 mg | ORAL_TABLET | Freq: Every day | ORAL | 1 refills | Status: DC
Start: 2020-10-13 — End: 2021-02-15

## 2020-11-28 ENCOUNTER — Ambulatory Visit
Admission: RE | Admit: 2020-11-28 | Discharge: 2020-11-28 | Disposition: A | Discharge status: Home / Self Care | Payer: BC Managed Care – PPO | Source: Ambulatory Visit | Attending: Family Medicine | Admitting: Family Medicine

## 2020-11-28 ENCOUNTER — Other Ambulatory Visit: Payer: Self-pay

## 2020-11-28 DIAGNOSIS — Z1231 Encounter for screening mammogram for malignant neoplasm of breast: Secondary | ICD-10-CM

## 2020-12-28 ENCOUNTER — Other Ambulatory Visit: Payer: Self-pay | Admitting: Physician Assistant

## 2020-12-28 DIAGNOSIS — E559 Vitamin D deficiency, unspecified: Secondary | ICD-10-CM

## 2021-01-23 ENCOUNTER — Other Ambulatory Visit (HOSPITAL_COMMUNITY): Payer: Self-pay | Admitting: Internal Medicine

## 2021-01-23 ENCOUNTER — Ambulatory Visit: Payer: Self-pay | Attending: Internal Medicine

## 2021-01-23 DIAGNOSIS — Z23 Encounter for immunization: Secondary | ICD-10-CM

## 2021-01-23 MED FILL — MODERNA COVID-19 VACCINE 10: 100 | 28 days supply | Qty: 0 | Fill #0

## 2021-01-23 NOTE — Progress Notes (Signed)
   Covid-19 Vaccination Clinic  Name:  Nancy Spears    MRN: [id number] DOB: December 09, 1966  01/23/2021  [name] was observed post Covid-19 immunization for 15 minutes without incident. She was provided with Vaccine Information Sheet and instruction to access the V-Safe system.   Vaccinated By: Bjorn Pippin  [name] was instructed to call 911 with any severe reactions post vaccine: Marland Kitchen Difficulty breathing  . Swelling of face and throat  . A fast heartbeat  . A bad rash all over body  . Dizziness and weakness   Immunizations Administered    Name Date Dose VIS Date Route   Moderna Covid-19 Booster Vaccine 01/23/2021 12:25 PM 0.25 mL 10/04/2020 Intramuscular   Manufacturer: Moderna   Lot: [id number]   NDC: 79432-761-47

## 2021-02-02 ENCOUNTER — Encounter: Payer: Self-pay | Admitting: Family Medicine

## 2021-02-05 NOTE — Telephone Encounter (Signed)
Patient picked up sleep apnea records today

## 2021-02-14 ENCOUNTER — Other Ambulatory Visit: Payer: Self-pay

## 2021-02-14 ENCOUNTER — Encounter: Payer: Self-pay | Admitting: Family Medicine

## 2021-02-14 ENCOUNTER — Ambulatory Visit (INDEPENDENT_AMBULATORY_CARE_PROVIDER_SITE_OTHER): Payer: BC Managed Care – PPO | Admitting: Family Medicine

## 2021-02-14 VITALS — BP 122/80 | HR 52 | Temp 97.5°F | Resp 17 | Ht 64.0 in | Wt 223.4 lb

## 2021-02-14 DIAGNOSIS — E119 Type 2 diabetes mellitus without complications: Secondary | ICD-10-CM

## 2021-02-14 DIAGNOSIS — E785 Hyperlipidemia, unspecified: Secondary | ICD-10-CM

## 2021-02-14 LAB — BASIC METABOLIC PANEL
BUN: 16 mg/dL (ref 6–23)
CO2: 30 mEq/L (ref 19–32)
Calcium: 9.6 mg/dL (ref 8.4–10.5)
Chloride: 98 mEq/L (ref 96–112)
Creatinine, Ser: 0.95 mg/dL (ref 0.40–1.20)
GFR: 67.15 mL/min (ref >60.00)
Glucose, Bld: 90 mg/dL (ref 70–99)
Potassium: 3.8 mEq/L (ref 3.5–5.1)
Sodium: 134 mEq/L — ABNORMAL LOW (ref 135–145)

## 2021-02-14 LAB — HEPATIC FUNCTION PANEL
ALT: 16 U/L (ref 0–35)
AST: 21 U/L (ref 0–37)
Albumin: 3.9 g/dL (ref 3.5–5.2)
Alkaline Phosphatase: 75 U/L (ref 39–117)
Bilirubin, Direct: 0.1 mg/dL (ref 0.0–0.3)
Total Bilirubin: 0.4 mg/dL (ref 0.2–1.2)
Total Protein: 8 g/dL (ref 6.0–8.3)

## 2021-02-14 LAB — LIPID PANEL
Cholesterol: 224 mg/dL — ABNORMAL HIGH (ref 0–200)
HDL: 51 mg/dL (ref >39.00)
LDL Cholesterol: 155 mg/dL — ABNORMAL HIGH (ref 0–99)
NonHDL: 172.59
Total CHOL/HDL Ratio: 4
Triglycerides: 87 mg/dL (ref 0.0–149.0)
VLDL: 17.4 mg/dL (ref 0.0–40.0)

## 2021-02-14 LAB — HEMOGLOBIN A1C: Hgb A1c MFr Bld: 6.6 % — ABNORMAL HIGH (ref 4.6–6.5)

## 2021-02-14 MED ORDER — HYDROCHLOROTHIAZIDE 12.5 MG PO TABS: 12.5000 mg | ORAL_TABLET | Freq: Every day | ORAL | 1 refills | Status: DC

## 2021-02-14 NOTE — Assessment & Plan Note (Signed)
Chronic problem.  Pt started her Lipitor 20mg  daily but 'it just tailed off'.  Check labs.  Restart meds prn

## 2021-02-14 NOTE — Patient Instructions (Signed)
Follow up in 3-4 months to recheck sugars We'll notify you of your lab results and make any changes if needed Continue to work on healthy diet and regular exercise- you can do it! Call with any questions or concerns Stay Safe!  Stay Healthy!

## 2021-02-14 NOTE — Assessment & Plan Note (Signed)
Pt's BMI is 38.35 but w/ her comorbidities she classifies as morbidly obese.  She is down 5 lbs since last visit.  Applauded her efforts.  Encouraged her to continue.  Will follow.

## 2021-02-14 NOTE — Assessment & Plan Note (Signed)
Ongoing issue for pt.  She is supposed to be taking Metformin 500mg  BID but never started her medication.  UTD on eye exam, microalbumin.  Foot exam done today.  Stressed need for healthy diet and regular exercise.  Check labs.  Adjust tx plan prn

## 2021-02-14 NOTE — Progress Notes (Signed)
   Subjective:    Patient ID: Nancy Spears, female    DOB: Aug 15, 1966, 55 y.o.   MRN: [id number]  HPI DM- chronic problem.  Last A1C 7.0%  Supposed to be on Metformin 500mg  BID but not taking.  Never started medication.  UTD on eye exam, microalbumin.  Will do foot exam today.  Pt is down 5 lbs since last visit.  No CP, SOB, HAs, visual changes.  No numbness/tingling of hands/feet.  Hyperlipidemia- chronic problem.  Not taking her Lipitor 20mg  daily as prescribed.  No abd pain, N/V.  Morbid obesity- pt's BMI is 38.35 and coupled w/ DM, HTN, hyperlipidemia this qualifies as morbid obesity.  She is down 5 lbs since last visit.  Pt has been walking 3x/week.   Review of Systems For ROS see HPI   This visit occurred during the SARS-CoV-2 public health emergency.  Safety protocols were in place, including screening questions prior to the visit, additional usage of staff PPE, and extensive cleaning of exam room while observing appropriate contact time as indicated for disinfecting solutions.       Objective:   Physical Exam Vitals reviewed.  Constitutional:      General: She is not in acute distress.    Appearance: Normal appearance. She is well-developed and well-nourished. She is obese.  HENT:     Head: Normocephalic and atraumatic.  Eyes:     Extraocular Movements: EOM normal.     Conjunctiva/sclera: Conjunctivae normal.     Pupils: Pupils are equal, round, and reactive to light.  Neck:     Thyroid: No thyromegaly.  Cardiovascular:     Rate and Rhythm: Normal rate and regular rhythm.     Pulses: Normal pulses and intact distal pulses.     Heart sounds: Normal heart sounds. No murmur heard.   Pulmonary:     Effort: Pulmonary effort is normal. No respiratory distress.     Breath sounds: Normal breath sounds.  Abdominal:     General: There is no distension.     Palpations: Abdomen is soft.     Tenderness: There is no abdominal tenderness.  Musculoskeletal:        General: No  edema.     Cervical back: Normal range of motion and neck supple.     Right lower leg: No edema.     Left lower leg: No edema.  Lymphadenopathy:     Cervical: No cervical adenopathy.  Skin:    General: Skin is warm and dry.  Neurological:     Mental Status: She is alert and oriented to person, place, and time.  Psychiatric:        Mood and Affect: Mood and affect normal.        Behavior: Behavior normal.           Assessment & Plan:

## 2021-02-15 ENCOUNTER — Other Ambulatory Visit: Payer: Self-pay

## 2021-02-15 DIAGNOSIS — E785 Hyperlipidemia, unspecified: Secondary | ICD-10-CM

## 2021-02-15 MED ORDER — ATORVASTATIN CALCIUM 20 MG PO TABS: 20.0000 mg | ORAL_TABLET | Freq: Every evening | ORAL | 0 refills | Status: DC

## 2021-06-13 ENCOUNTER — Encounter: Payer: Self-pay | Admitting: *Deleted

## 2021-06-19 ENCOUNTER — Encounter: Payer: Self-pay | Admitting: Family Medicine

## 2021-06-19 ENCOUNTER — Ambulatory Visit: Payer: BC Managed Care – PPO | Admitting: Family Medicine

## 2021-06-19 ENCOUNTER — Other Ambulatory Visit: Payer: Self-pay

## 2021-06-19 VITALS — BP 120/80 | HR 60 | Temp 97.3°F | Resp 17 | Ht 64.0 in | Wt 225.8 lb

## 2021-06-19 DIAGNOSIS — E119 Type 2 diabetes mellitus without complications: Secondary | ICD-10-CM | POA: Diagnosis not present

## 2021-06-19 DIAGNOSIS — E785 Hyperlipidemia, unspecified: Secondary | ICD-10-CM

## 2021-06-19 LAB — HEMOGLOBIN A1C: Hgb A1c MFr Bld: 6.8 % — ABNORMAL HIGH (ref 4.6–6.5)

## 2021-06-19 LAB — BASIC METABOLIC PANEL
BUN: 14 mg/dL (ref 6–23)
CO2: 29 mEq/L (ref 19–32)
Calcium: 9.5 mg/dL (ref 8.4–10.5)
Chloride: 99 mEq/L (ref 96–112)
Creatinine, Ser: 1.02 mg/dL (ref 0.40–1.20)
GFR: 61.51 mL/min (ref >60.00)
Glucose, Bld: 83 mg/dL (ref 70–99)
Potassium: 3.8 mEq/L (ref 3.5–5.1)
Sodium: 136 mEq/L (ref 135–145)

## 2021-06-19 MED ORDER — ATORVASTATIN CALCIUM 20 MG PO TABS: 20.0000 mg | ORAL_TABLET | Freq: Every evening | ORAL | 1 refills | Status: DC

## 2021-06-19 NOTE — Progress Notes (Signed)
   Subjective:    Patient ID: Nancy Spears, female    DOB: 01/04/1966, 55 y.o.   MRN: [id number]  HPI DM- chronic problem, prescribed Metformin 500mg  BID but not taking.  UTD on foot exam.  Due for eye exam.  UTD on microalbumin.  Last A1C 6.6%  Pt reports feeling pretty good.  Has L knee pain which sometimes limit exercise.  When she is feeling good she is walking regularly- 'at least 3x/week'.  No CP, SOB, HAs, visual changes, abd pain, N/V.  No weakness/numbness of hands/feet.  Has eye exam upcoming this month.  Hyperlipidemia- chronic problem.  Was taking Lipitor x3-4 weeks and then just fell out of routine.   Review of Systems For ROS see HPI   This visit occurred during the SARS-CoV-2 public health emergency.  Safety protocols were in place, including screening questions prior to the visit, additional usage of staff PPE, and extensive cleaning of exam room while observing appropriate contact time as indicated for disinfecting solutions.      Objective:   Physical Exam Constitutional:      General: She is not in acute distress.    Appearance: Normal appearance. She is well-developed. She is obese.  HENT:     Head: Normocephalic and atraumatic.  Eyes:     Conjunctiva/sclera: Conjunctivae normal.     Pupils: Pupils are equal, round, and reactive to light.  Neck:     Thyroid: No thyromegaly.  Cardiovascular:     Rate and Rhythm: Normal rate and regular rhythm.     Pulses: Normal pulses.     Heart sounds: Normal heart sounds. No murmur heard. Pulmonary:     Effort: Pulmonary effort is normal. No respiratory distress.     Breath sounds: Normal breath sounds.  Abdominal:     General: There is no distension.     Palpations: Abdomen is soft.     Tenderness: There is no abdominal tenderness.  Musculoskeletal:     Cervical back: Normal range of motion and neck supple.     Right lower leg: No edema.     Left lower leg: No edema.  Lymphadenopathy:     Cervical: No cervical  adenopathy.  Skin:    General: Skin is warm and dry.  Neurological:     Mental Status: She is alert and oriented to person, place, and time.  Psychiatric:        Behavior: Behavior normal.          Assessment & Plan:

## 2021-06-19 NOTE — Assessment & Plan Note (Signed)
Chronic problem.  Last A1C 6.6%.  Not currently on Metformin.  Pt to schedule eye exam this month.  UTD on microalbumin, foot exam.  Check labs.  Start meds prn.

## 2021-06-19 NOTE — Assessment & Plan Note (Signed)
Chronic problem.  Took Lipitor x3-4 weeks but then got out of routine.  Encouraged her to restart but to take them in the morning w/ her other medications so she won't forget.  Pt expressed understanding and is in agreement w/ plan.

## 2021-06-19 NOTE — Patient Instructions (Signed)
Schedule your complete physical for late October We'll notify you of your A1C and make any changes if needed Continue to work on low carb diet and regular exercise- you're doing great!!! Have them send me a copy of your eye exam report Call with any questions or concerns Stay Safe!  Stay Healthy! Have a great summer!!!

## 2021-08-07 ENCOUNTER — Other Ambulatory Visit: Payer: Self-pay

## 2021-08-07 MED ORDER — HYDROCHLOROTHIAZIDE 12.5 MG PO TABS: 12.5000 mg | ORAL_TABLET | Freq: Every day | ORAL | 1 refills | Status: DC

## 2021-10-17 ENCOUNTER — Ambulatory Visit (INDEPENDENT_AMBULATORY_CARE_PROVIDER_SITE_OTHER): Payer: BC Managed Care – PPO | Admitting: Family Medicine

## 2021-10-17 ENCOUNTER — Encounter: Payer: Self-pay | Admitting: Family Medicine

## 2021-10-17 ENCOUNTER — Other Ambulatory Visit: Payer: Self-pay

## 2021-10-17 VITALS — BP 128/88 | HR 61 | Temp 97.2°F | Resp 18 | Ht 64.0 in | Wt 226.6 lb

## 2021-10-17 DIAGNOSIS — Z23 Encounter for immunization: Secondary | ICD-10-CM | POA: Diagnosis not present

## 2021-10-17 DIAGNOSIS — E785 Hyperlipidemia, unspecified: Secondary | ICD-10-CM

## 2021-10-17 DIAGNOSIS — Z0000 Encounter for general adult medical examination without abnormal findings: Secondary | ICD-10-CM | POA: Diagnosis not present

## 2021-10-17 DIAGNOSIS — E559 Vitamin D deficiency, unspecified: Secondary | ICD-10-CM | POA: Diagnosis not present

## 2021-10-17 DIAGNOSIS — E119 Type 2 diabetes mellitus without complications: Secondary | ICD-10-CM | POA: Diagnosis not present

## 2021-10-17 LAB — CBC WITH DIFFERENTIAL/PLATELET
Basophils Absolute: 0 10*3/uL (ref 0.0–0.1)
Basophils Relative: 0.7 % (ref 0.0–3.0)
Eosinophils Absolute: 0.2 10*3/uL (ref 0.0–0.7)
Eosinophils Relative: 3.1 % (ref 0.0–5.0)
HCT: 36.9 % (ref 36.0–46.0)
Hemoglobin: 12.1 g/dL (ref 12.0–15.0)
Lymphocytes Relative: 33.5 % (ref 12.0–46.0)
Lymphs Abs: 2.1 10*3/uL (ref 0.7–4.0)
MCHC: 32.7 g/dL (ref 30.0–36.0)
MCV: 85 fl (ref 78.0–100.0)
Monocytes Absolute: 0.6 10*3/uL (ref 0.1–1.0)
Monocytes Relative: 10.2 % (ref 3.0–12.0)
Neutro Abs: 3.2 10*3/uL (ref 1.4–7.7)
Neutrophils Relative %: 52.5 % (ref 43.0–77.0)
Platelets: 193 10*3/uL (ref 150.0–400.0)
RBC: 4.34 Mil/uL (ref 3.87–5.11)
RDW: 13.4 % (ref 11.5–15.5)
WBC: 6.2 10*3/uL (ref 4.0–10.5)

## 2021-10-17 LAB — HEPATIC FUNCTION PANEL
ALT: 16 U/L (ref 0–35)
AST: 16 U/L (ref 0–37)
Albumin: 4 g/dL (ref 3.5–5.2)
Alkaline Phosphatase: 89 U/L (ref 39–117)
Bilirubin, Direct: 0.1 mg/dL (ref 0.0–0.3)
Total Bilirubin: 0.4 mg/dL (ref 0.2–1.2)
Total Protein: 7.9 g/dL (ref 6.0–8.3)

## 2021-10-17 LAB — BASIC METABOLIC PANEL
BUN: 16 mg/dL (ref 6–23)
CO2: 31 mEq/L (ref 19–32)
Calcium: 9.5 mg/dL (ref 8.4–10.5)
Chloride: 100 mEq/L (ref 96–112)
Creatinine, Ser: 1.06 mg/dL (ref 0.40–1.20)
GFR: 58.6 mL/min — ABNORMAL LOW (ref >60.00)
Glucose, Bld: 141 mg/dL — ABNORMAL HIGH (ref 70–99)
Potassium: 3.8 mEq/L (ref 3.5–5.1)
Sodium: 137 mEq/L (ref 135–145)

## 2021-10-17 LAB — LIPID PANEL
Cholesterol: 214 mg/dL — ABNORMAL HIGH (ref 0–200)
HDL: 46.9 mg/dL (ref >39.00)
LDL Cholesterol: 143 mg/dL — ABNORMAL HIGH (ref 0–99)
NonHDL: 166.97
Total CHOL/HDL Ratio: 5
Triglycerides: 118 mg/dL (ref 0.0–149.0)
VLDL: 23.6 mg/dL (ref 0.0–40.0)

## 2021-10-17 LAB — VITAMIN D 25 HYDROXY (VIT D DEFICIENCY, FRACTURES): VITD: 31.92 ng/mL (ref 30.00–100.00)

## 2021-10-17 LAB — MICROALBUMIN / CREATININE URINE RATIO
Creatinine,U: 119 mg/dL
Microalb Creat Ratio: 0.6 mg/g (ref 0.0–30.0)
Microalb, Ur: <0.7 mg/dL (ref 0.0–1.9)

## 2021-10-17 LAB — HEMOGLOBIN A1C: Hgb A1c MFr Bld: 7.1 % — ABNORMAL HIGH (ref 4.6–6.5)

## 2021-10-17 LAB — TSH: TSH: 2.38 u[IU]/mL (ref 0.35–5.50)

## 2021-10-17 MED ORDER — ATORVASTATIN CALCIUM 20 MG PO TABS: 20.0000 mg | ORAL_TABLET | Freq: Every evening | ORAL | 1 refills | Status: DC

## 2021-10-17 NOTE — Assessment & Plan Note (Signed)
Check labs and replete prn. 

## 2021-10-17 NOTE — Addendum Note (Signed)
Addended by: Lenard Simmer on: 10/17/2021 08:46 AM   Modules accepted: Orders

## 2021-10-17 NOTE — Progress Notes (Signed)
Subjective:    Patient ID: Nancy Spears, female    DOB: 03/28/1966, 55 y.o.   MRN: [id number]  HPI CPE- UTD on colonoscopy, pap, mammo, Tdap.  Will get flu today.  Reports eye exam- will attempt to track it down.  Patient Care Team    Relationship Specialty Notifications Start End  Sheliah Hatch, MD PCP - General   11/28/10   Kirkland Hun, MD Consulting Physician Obstetrics and Gynecology  02/12/16   Glenford Peers, OD Referring Physician Optometry  02/12/16   Merrily Pew, MD Consulting Physician Ophthalmology  02/12/16     Health Maintenance  Topic Date Due   Pneumococcal Vaccine 51-104 Years old (1 - PCV) Never done   Zoster Vaccines- Shingrix (1 of 2) Never done   OPHTHALMOLOGY EXAM  03/16/2021   INFLUENZA VACCINE  07/16/2021   URINE MICROALBUMIN  10/12/2021   COVID-19 Vaccine (3 - Booster for Moderna series) 12/15/2021 (Originally 03/20/2021)   HIV Screening  06/19/2022 (Originally 12/25/1979)   Hepatitis C Screening  10/17/2022 (Originally 12/24/1982)   HEMOGLOBIN A1C  12/20/2021   PAP SMEAR-Modifier  01/12/2022   FOOT EXAM  02/14/2022   MAMMOGRAM  11/28/2022   TETANUS/TDAP  01/15/2024   COLONOSCOPY (Pts 45-93yrs Insurance coverage will need to be confirmed)  03/12/2025   HPV VACCINES  Aged Out      Review of Systems Patient reports no vision/ hearing changes, adenopathy,fever, weight change,  persistant/recurrent hoarseness , swallowing issues, chest pain, palpitations, edema, persistant/recurrent cough, hemoptysis, dyspnea (rest/exertional/paroxysmal nocturnal), gastrointestinal bleeding (melena, rectal bleeding), abdominal pain, significant heartburn, bowel changes, GU symptoms (dysuria, hematuria, incontinence), Gyn symptoms (abnormal  bleeding, pain),  syncope, focal weakness, memory loss, numbness & tingling, skin/hair/nail changes, abnormal bruising or bleeding, anxiety, or depression.   This visit occurred during the SARS-CoV-2 public health emergency.  Safety  protocols were in place, including screening questions prior to the visit, additional usage of staff PPE, and extensive cleaning of exam room while observing appropriate contact time as indicated for disinfecting solutions.      Objective:   Physical Exam General Appearance:    Alert, cooperative, no distress, appears stated age, obese  Head:    Normocephalic, without obvious abnormality, atraumatic  Eyes:    PERRL, conjunctiva/corneas clear, EOM's intact, fundi    benign, both eyes  Ears:    Normal TM's and external ear canals, both ears  Nose:   Deferred due to COVID  Throat:   Neck:   Supple, symmetrical, trachea midline, no adenopathy;    Thyroid: no enlargement/tenderness/nodules  Back:     Symmetric, no curvature, ROM normal, no CVA tenderness  Lungs:     Clear to auscultation bilaterally, respirations unlabored  Chest Wall:    No tenderness or deformity   Heart:    Regular rate and rhythm, S1 and S2 normal, no murmur, rub   or gallop  Breast Exam:    Deferred to mammo  Abdomen:     Soft, non-tender, bowel sounds active all four quadrants,    no masses, no organomegaly  Genitalia:    Deferred to GYN  Rectal:    Extremities:   Extremities normal, atraumatic, no cyanosis or edema  Pulses:   2+ and symmetric all extremities  Skin:   Skin color, texture, turgor normal, no rashes or lesions  Lymph nodes:   Cervical, supraclavicular, and axillary nodes normal  Neurologic:   CNII-XII intact, normal strength, sensation and reflexes    throughout  Assessment & Plan:

## 2021-10-17 NOTE — Assessment & Plan Note (Signed)
Chronic problem.  Currently diet controlled.  UTD on eye exam, foot exam.  Due for microalbumin.  Check labs.  Start meds if needed.

## 2021-10-17 NOTE — Patient Instructions (Addendum)
Follow up in 3-4 months to recheck diabetes We'll notify you of your lab results and make any changes if needed Continue to work on healthy diet and regular exercise- I'm so proud of you!!! Call with any questions or concerns Stay Safe!  Stay Healthy! Happy Holidays!!

## 2021-10-17 NOTE — Assessment & Plan Note (Signed)
Pt's PE WNL w/ exception of obesity.  UTD on pap, mammo, colonoscopy, Tdap.  Flu given today.  Check labs.  Anticipatory guidance provided.

## 2021-10-25 ENCOUNTER — Encounter: Payer: Self-pay | Admitting: Family Medicine

## 2021-10-26 ENCOUNTER — Other Ambulatory Visit: Payer: Self-pay | Admitting: Family Medicine

## 2021-10-26 DIAGNOSIS — Z1231 Encounter for screening mammogram for malignant neoplasm of breast: Secondary | ICD-10-CM

## 2021-10-29 ENCOUNTER — Telehealth (INDEPENDENT_AMBULATORY_CARE_PROVIDER_SITE_OTHER): Payer: BC Managed Care – PPO | Admitting: Registered Nurse

## 2021-10-29 ENCOUNTER — Other Ambulatory Visit: Payer: Self-pay

## 2021-10-29 ENCOUNTER — Encounter: Payer: Self-pay | Admitting: Registered Nurse

## 2021-10-29 DIAGNOSIS — J329 Chronic sinusitis, unspecified: Secondary | ICD-10-CM

## 2021-10-29 DIAGNOSIS — B9689 Other specified bacterial agents as the cause of diseases classified elsewhere: Secondary | ICD-10-CM

## 2021-10-29 MED ORDER — AZELASTINE HCL 0.1 % NA SOLN: 1.0000 | Freq: Two times a day (BID) | NASAL | 12 refills | Status: DC

## 2021-10-29 MED ORDER — AMOXICILLIN-POT CLAVULANATE 875-125 MG PO TABS: 1.0000 | ORAL_TABLET | Freq: Two times a day (BID) | ORAL | 0 refills | Status: DC

## 2021-10-29 NOTE — Patient Instructions (Addendum)
Ms. Vukelich -  Randie Heinz to speak with you  Augmentin twice daily for ten days Use azelastine one spray each nostril twice daily as needed Ok to continue with OTC antihistamines and can use flonase if you'd like Supplements are fine as well.  If worsening or failing to improve by Thursday afternoon, call me   Thank you  Rich     If you have lab work done today you will be contacted with your lab results within the next 2 weeks.  If you have not heard from [place] then please contact us. The fastest way to get your results is to register for My Chart.   IF you received an x-ray today, you will receive an invoice from [facility]. Please contact [facility] at [phone] with questions or concerns regarding your invoice.   IF you received labwork today, you will receive an invoice from [facility]. Please contact [facility] at [phone] with questions or concerns regarding your invoice.   Our billing staff will not be able to assist you with questions regarding bills from these companies.  You will be contacted with the lab results as soon as they are available. The fastest way to get your results is to activate your My Chart account. Instructions are located on the last page of this paperwork. If you have not heard from [place] regarding the results in 2 weeks, please contact this office.

## 2021-10-29 NOTE — Progress Notes (Addendum)
Telemedicine Encounter- SOAP NOTE Established Patient  This telephone encounter was conducted with the patient's (or proxy's) verbal consent via audio telecommunications: yes/no: Yes Patient was instructed to have this encounter in a suitably private space; and to only have persons present to whom they give permission to participate. In addition, patient identity was confirmed by use of name plus two identifiers (DOB and address).  I discussed the limitations, risks, security and privacy concerns of performing an evaluation and management service by telephone and the availability of in person appointments. I also discussed with the patient that there may be a patient responsible charge related to this service. The patient expressed understanding and agreed to proceed.  I spent a total of 16 minutes talking with the patient or their proxy.  Patient at home Provider in office  Participants: Jari Sportsman, NP and Darien Ramus  Chief Complaint  Patient presents with   Sinusitis    Patient states she for a week she thinks she has been battling a severe sinus infection. She has drainage, congestion, and coughing up Dark yellow/green phlegm. Patient states she has took some allergy medication and crushed garlic ,but symptoms still there.    Subjective   Nancy Spears is a 55 y.o. established patient. Telephone visit today for sinusitis  HPI Symptoms onset 1 week ago Sinus congestion, pnd, cough, dark yellow/green mucus Hx of sinus infection - remote hx - feels same as before. No sick contacts No lower respiratory congestion - denies shob, doe, chest pain, chest congestion Denies fevers, chills, fatigue, sweats, nvd Vaccinated x 2 and boosted x 1 vs. Covid    Patient Active Problem List   Diagnosis Date Noted   Vitamin D deficiency 02/20/2018   Diabetes mellitus type II, controlled, with no complications (HCC) 11/19/2017   Edema 05/15/2013   HTN (hypertension) 05/13/2013   Routine  general medical examination at a health care facility 12/03/2012   Seasonal allergic rhinitis 05/06/2012   Hyperlipidemia 05/31/2009   OBSTRUCTIVE SLEEP APNEA 05/04/2009   PES PLANUS 04/27/2009   UNEQUAL LEG LENGTH 04/27/2009   Morbid obesity (HCC) 04/20/2009   GERD 04/20/2009   KNEE PAIN, RIGHT 04/20/2009    Past Medical History:  Diagnosis Date   Breast mass    Diabetes mellitus without complication (HCC)    Fibrocystic breast    Glaucoma    Hyperlipidemia    Hypertension    Obesity    Vitamin D deficiency     Current Outpatient Medications  Medication Sig Dispense Refill   amoxicillin-clavulanate (AUGMENTIN) 875-125 MG tablet Take 1 tablet by mouth 2 (two) times daily. 20 tablet 0   atorvastatin (LIPITOR) 20 MG tablet Take 1 tablet (20 mg total) by mouth at bedtime. 90 tablet 1   azelastine (ASTELIN) 0.1 % nasal spray Place 1 spray into both nostrils 2 (two) times daily. Use in each nostril as directed 30 mL 12   COVID-19 mRNA vaccine, Moderna, 100 MCG/0.5ML injection INJECT AS DIRECTED .25 mL 0   hydrochlorothiazide (HYDRODIURIL) 12.5 MG tablet Take 1 tablet (12.5 mg total) by mouth daily. 90 tablet 1   loratadine (CLARITIN) 10 MG tablet Claritin 10 mg tablet  Take 1 tablet every day by oral route.     timolol (TIMOPTIC) 0.5 % ophthalmic solution      TRAVATAN Z 0.004 % SOLN ophthalmic solution      No current facility-administered medications for this visit.    No Known Allergies  Social History  Socioeconomic History   Marital status: Married    Spouse name: Not on file   Number of children: Not on file   Years of education: Not on file   Highest education level: Not on file  Occupational History   Not on file  Tobacco Use   Smoking status: Never   Smokeless tobacco: Never  Vaping Use   Vaping Use: Never used  Substance and Sexual Activity   Alcohol use: Yes    Alcohol/week: 0.0 standard drinks    Comment: once monthly   Drug use: No   Sexual  activity: Yes    Birth control/protection: Surgical    Comment: BTL  Other Topics Concern   Not on file  Social History Narrative   Not on file   Social Determinants of Health   Financial Resource Strain: Not on file  Food Insecurity: Not on file  Transportation Needs: Not on file  Physical Activity: Not on file  Stress: Not on file  Social Connections: Not on file  Intimate Partner Violence: Not on file    Review of Systems  Constitutional: Negative.  Negative for fever and malaise/fatigue.  HENT:  Positive for congestion and sinus pain. Negative for ear discharge, ear pain, hearing loss, nosebleeds, sore throat and tinnitus.   Eyes: Negative.   Respiratory:  Positive for cough and sputum production. Negative for hemoptysis, shortness of breath, wheezing and stridor.   Cardiovascular: Negative.   Gastrointestinal: Negative.   Genitourinary: Negative.   Musculoskeletal: Negative.   Skin: Negative.   Neurological: Negative.   Endo/Heme/Allergies: Negative.   Psychiatric/Behavioral: Negative.    All other systems reviewed and are negative.  Objective   Vitals as reported by the patient: There were no vitals filed for this visit.  [name] was seen today for sinusitis.  Diagnoses and all orders for this visit:  Bacterial sinusitis -     amoxicillin-clavulanate (AUGMENTIN) 875-125 MG tablet; Take 1 tablet by mouth 2 (two) times daily. -     azelastine (ASTELIN) 0.1 % nasal spray; Place 1 spray into both nostrils 2 (two) times daily. Use in each nostril as directed   PLAN Given duration will tx as bacterial. Augmentin po bid as above Azelastine for supportive care. Recommend sinus rinse with saline. Ok to continue OTC antihistamines and use flonase if desired Return precautions reviewed Patient encouraged to call clinic with any questions, comments, or concerns.  I discussed the assessment and treatment plan with the patient. The patient was provided an opportunity to  ask questions and all were answered. The patient agreed with the plan and demonstrated an understanding of the instructions.   The patient was advised to call back or seek an in-person evaluation if the symptoms worsen or if the condition fails to improve as anticipated.  I provided 16 minutes of non-face-to-face time during this encounter.  Janeece Agee, NP

## 2021-12-04 ENCOUNTER — Ambulatory Visit
Admission: RE | Admit: 2021-12-04 | Discharge: 2021-12-04 | Disposition: A | Discharge status: Home / Self Care | Payer: BC Managed Care – PPO | Source: Ambulatory Visit | Attending: Family Medicine | Admitting: Family Medicine

## 2021-12-04 DIAGNOSIS — Z1231 Encounter for screening mammogram for malignant neoplasm of breast: Secondary | ICD-10-CM

## 2022-02-14 ENCOUNTER — Ambulatory Visit: Payer: BC Managed Care – PPO | Admitting: Family Medicine

## 2022-02-14 ENCOUNTER — Encounter: Payer: Self-pay | Admitting: Family Medicine

## 2022-02-14 VITALS — BP 134/80 | HR 74 | Temp 97.4°F | Resp 16 | Wt 225.4 lb

## 2022-02-14 DIAGNOSIS — E119 Type 2 diabetes mellitus without complications: Secondary | ICD-10-CM | POA: Diagnosis not present

## 2022-02-14 DIAGNOSIS — E785 Hyperlipidemia, unspecified: Secondary | ICD-10-CM | POA: Diagnosis not present

## 2022-02-14 LAB — BASIC METABOLIC PANEL
BUN: 17 mg/dL (ref 6–23)
CO2: 29 mEq/L (ref 19–32)
Calcium: 9.4 mg/dL (ref 8.4–10.5)
Chloride: 100 mEq/L (ref 96–112)
Creatinine, Ser: 1.01 mg/dL (ref 0.40–1.20)
GFR: 61.95 mL/min (ref >60.00)
Glucose, Bld: 138 mg/dL — ABNORMAL HIGH (ref 70–99)
Potassium: 3.9 mEq/L (ref 3.5–5.1)
Sodium: 138 mEq/L (ref 135–145)

## 2022-02-14 LAB — HEMOGLOBIN A1C: Hgb A1c MFr Bld: 7.3 % — ABNORMAL HIGH (ref 4.6–6.5)

## 2022-02-14 LAB — LIPID PANEL
Cholesterol: 172 mg/dL (ref 0–200)
HDL: 52.4 mg/dL (ref >39.00)
LDL Cholesterol: 100 mg/dL — ABNORMAL HIGH (ref 0–99)
NonHDL: 119.33
Total CHOL/HDL Ratio: 3
Triglycerides: 97 mg/dL (ref 0.0–149.0)
VLDL: 19.4 mg/dL (ref 0.0–40.0)

## 2022-02-14 LAB — HEPATIC FUNCTION PANEL
ALT: 16 U/L (ref 0–35)
AST: 16 U/L (ref 0–37)
Albumin: 4.2 g/dL (ref 3.5–5.2)
Alkaline Phosphatase: 102 U/L (ref 39–117)
Bilirubin, Direct: 0.1 mg/dL (ref 0.0–0.3)
Total Bilirubin: 0.4 mg/dL (ref 0.2–1.2)
Total Protein: 8 g/dL (ref 6.0–8.3)

## 2022-02-14 NOTE — Assessment & Plan Note (Signed)
Chronic problem.  Has been attempting to control w/ diet and exercise.  Last A1C 7.1%  UTD on eye exam.  Foot exam done today.  UTD on microalbumin.  Check labs.  Start meds prn. ?

## 2022-02-14 NOTE — Progress Notes (Signed)
? ?  Subjective:  ? ? Patient ID: Nancy Spears, female    DOB: 1966-11-23, 56 y.o.   MRN: [id number] ? ?HPI ?DM- chronic problem for pt.  Last A1C 7.1%  UTD on eye exam.  Due for foot exam.  UTD on microalbumin.  Pt reports feeling good w/ exception of fatigue due to being busy.  No CP, SOB, HAs, visual changes, abd pain, N/V.  No numbness/tingling of hands/feet.  Has increased water intake.  Was previously walking 3x/week.  Admits that the last 3 weeks she has been out of her routine. ? ?Hyperlipidemia- at last visit LDL was 143.  Now regularly taking Atorvastatin 20mg  daily ? ?Morbid obesity- pt is down 2 lbs since last visit.  Admits that she has been out of her exercise routine for the last 3 weeks but plans to resume.  She's excited for nicer weather so she can walk more. ? ? ?Review of Systems ?For ROS see HPI  ? ?This visit occurred during the SARS-CoV-2 public health emergency.  Safety protocols were in place, including screening questions prior to the visit, additional usage of staff PPE, and extensive cleaning of exam room while observing appropriate contact time as indicated for disinfecting solutions.   ?   ?Objective:  ? Physical Exam ?Vitals reviewed.  ?Constitutional:   ?   General: She is not in acute distress. ?   Appearance: Normal appearance. She is well-developed. She is obese. She is not ill-appearing.  ?HENT:  ?   Head: Normocephalic and atraumatic.  ?Eyes:  ?   Conjunctiva/sclera: Conjunctivae normal.  ?   Pupils: Pupils are equal, round, and reactive to light.  ?Neck:  ?   Thyroid: No thyromegaly.  ?Cardiovascular:  ?   Rate and Rhythm: Normal rate and regular rhythm.  ?   Pulses: Normal pulses.  ?   Heart sounds: Normal heart sounds. No murmur heard. ?Pulmonary:  ?   Effort: Pulmonary effort is normal. No respiratory distress.  ?   Breath sounds: Normal breath sounds.  ?Abdominal:  ?   General: There is no distension.  ?   Palpations: Abdomen is soft.  ?   Tenderness: There is no abdominal  tenderness.  ?Musculoskeletal:  ?   Cervical back: Normal range of motion and neck supple.  ?   Right lower leg: No edema.  ?   Left lower leg: No edema.  ?Lymphadenopathy:  ?   Cervical: No cervical adenopathy.  ?Skin: ?   General: Skin is warm and dry.  ?Neurological:  ?   Mental Status: She is alert and oriented to person, place, and time.  ?Psychiatric:     ?   Behavior: Behavior normal.  ? ? ? ? ? ?   ?Assessment & Plan:  ? ? ?

## 2022-02-14 NOTE — Assessment & Plan Note (Signed)
Chronic problem.  Pt reports she is now taking her Atorvastatin regularly since LDL was high at last visit.  Check labs.  Adjust meds prn  ?

## 2022-02-14 NOTE — Assessment & Plan Note (Signed)
Ongoing issue for pt.  BMI now 38.69 and coupled w/ her other medical issues this qualifies as morbidly obese.  Pt reports she will resume her exercise program.  Encouraged her to get back out there.  Will follow. ?

## 2022-02-14 NOTE — Patient Instructions (Signed)
Follow up in 3-4 months to recheck diabetes, blood pressure, and cholesterol ?We'll notify you of your lab results and make any changes if needed ?Continue to work on healthy diet and regular exercise- you're doing great!! ?Call with any questions or concerns ?Stay Safe!  Stay Healthy! ?Happy Spring!!! ?

## 2022-02-15 ENCOUNTER — Telehealth: Payer: Self-pay

## 2022-02-15 NOTE — Telephone Encounter (Signed)
-----   Message from Sheliah Hatch, MD sent at 02/15/2022  7:32 AM EST ----- ?Cholesterol looks MUCH better!  Continue the Atorvastatin and keep up the good work! ? ?A1C has increased to 7.3%  Please make sure you are working on a low carb diet and regular physical activity to improve this number and avoid starting medication. ? ?Remainder of labs look good! ?

## 2022-02-15 NOTE — Telephone Encounter (Signed)
Patient aware of labs.  

## 2022-03-15 ENCOUNTER — Other Ambulatory Visit: Payer: Self-pay

## 2022-03-15 MED ORDER — HYDROCHLOROTHIAZIDE 12.5 MG PO TABS: 12.5000 mg | ORAL_TABLET | Freq: Every day | ORAL | 1 refills | Status: DC

## 2022-05-17 ENCOUNTER — Encounter: Payer: Self-pay | Admitting: Family Medicine

## 2022-05-17 ENCOUNTER — Ambulatory Visit: Payer: BC Managed Care – PPO | Admitting: Family Medicine

## 2022-05-17 VITALS — BP 128/76 | HR 77 | Temp 97.6°F | Resp 18 | Ht 64.0 in | Wt 229.4 lb

## 2022-05-17 DIAGNOSIS — E119 Type 2 diabetes mellitus without complications: Secondary | ICD-10-CM

## 2022-05-17 LAB — BASIC METABOLIC PANEL
BUN: 16 mg/dL (ref 6–23)
CO2: 29 mEq/L (ref 19–32)
Calcium: 9.3 mg/dL (ref 8.4–10.5)
Chloride: 99 mEq/L (ref 96–112)
Creatinine, Ser: 1.03 mg/dL (ref 0.40–1.20)
GFR: 60.41 mL/min (ref >60.00)
Glucose, Bld: 156 mg/dL — ABNORMAL HIGH (ref 70–99)
Potassium: 3.4 mEq/L — ABNORMAL LOW (ref 3.5–5.1)
Sodium: 136 mEq/L (ref 135–145)

## 2022-05-17 LAB — HEMOGLOBIN A1C: Hgb A1c MFr Bld: 7.5 % — ABNORMAL HIGH (ref 4.6–6.5)

## 2022-05-17 MED ORDER — EMPAGLIFLOZIN 10 MG PO TABS: 10.0000 mg | ORAL_TABLET | Freq: Every day | ORAL | 3 refills | Status: DC

## 2022-05-17 NOTE — Addendum Note (Signed)
Addended by: Midge Minium on: 05/17/2022 03:31 PM   Modules accepted: Orders

## 2022-05-17 NOTE — Patient Instructions (Addendum)
Follow up in 3-4 months to recheck sugar, BP, and cholesterol We'll notify you of your lab results and make any changes if needed Continue to work on low carb diet and regular exercise- you can do it! Call with any questions or concerns Stay Safe!  Stay Healthy! Have a great summer!!

## 2022-05-17 NOTE — Assessment & Plan Note (Signed)
Chronic problem.  Currently trying to control w/ low carb diet and regular exercise.  UTD on foot exam, eye exam, microalbumin.  Check labs.  Anticipatory guidance provided.

## 2022-05-17 NOTE — Progress Notes (Signed)
   Subjective:    Patient ID: Nancy Spears, female    DOB: [birth year], 56 y.o.   MRN: [id number]  HPI DM- chronic problem, last A1C 7.3%.  Not currently on medication.  Attempting to control w/ diet and exercise.  Unfortunately has gained 5 lbs since last visit.  Work schedule has been irregular so she's not walking like she used to.  UTD on eye exam, microalbumin, foot exam.  No CP, SOB, HAs, visual changes, abd pain, N/V.  No numbness/tingling of hands/feet.   Review of Systems For ROS see HPI     Objective:   Physical Exam Vitals reviewed.  Constitutional:      General: She is not in acute distress.    Appearance: Normal appearance. She is well-developed. She is obese. She is not ill-appearing.  HENT:     Head: Normocephalic and atraumatic.  Eyes:     Conjunctiva/sclera: Conjunctivae normal.     Pupils: Pupils are equal, round, and reactive to light.  Neck:     Thyroid: No thyromegaly.  Cardiovascular:     Rate and Rhythm: Normal rate and regular rhythm.     Pulses: Normal pulses.     Heart sounds: Normal heart sounds. No murmur heard. Pulmonary:     Effort: Pulmonary effort is normal. No respiratory distress.     Breath sounds: Normal breath sounds.  Abdominal:     General: There is no distension.     Palpations: Abdomen is soft.     Tenderness: There is no abdominal tenderness.  Musculoskeletal:     Cervical back: Normal range of motion and neck supple.     Right lower leg: No edema.     Left lower leg: No edema.  Lymphadenopathy:     Cervical: No cervical adenopathy.  Skin:    General: Skin is warm and dry.  Neurological:     Mental Status: She is alert and oriented to person, place, and time.  Psychiatric:        Behavior: Behavior normal.          Assessment & Plan:

## 2022-05-20 ENCOUNTER — Telehealth: Payer: Self-pay

## 2022-05-20 ENCOUNTER — Other Ambulatory Visit: Payer: Self-pay

## 2022-05-20 DIAGNOSIS — E119 Type 2 diabetes mellitus without complications: Secondary | ICD-10-CM

## 2022-05-20 MED ORDER — EMPAGLIFLOZIN 10 MG PO TABS: 10.0000 mg | ORAL_TABLET | Freq: Every day | ORAL | 1 refills | Status: DC

## 2022-05-20 NOTE — Telephone Encounter (Signed)
-----   Message from Sheliah Hatch, MD sent at 05/17/2022  3:31 PM EDT ----- A1C continues to creep up.  It is now at 7.5%  At this time, I would recommend starting Jardiance 10mg  daily to better control sugars.  This medication causes you to excrete excess sugar in your urine so it is important to make sure you wipe well and are dry after urinating to avoid yeast infections.  It also has a 'side effect' of weight loss.

## 2022-05-20 NOTE — Telephone Encounter (Signed)
Spoke w/ pt and informed her of lab results . Sent Jardiance 10 to the pharmacy

## 2022-09-09 ENCOUNTER — Other Ambulatory Visit: Payer: Self-pay | Admitting: Family Medicine

## 2022-09-16 ENCOUNTER — Encounter: Payer: Self-pay | Admitting: Family Medicine

## 2022-09-16 ENCOUNTER — Ambulatory Visit: Payer: BC Managed Care – PPO | Admitting: Family Medicine

## 2022-09-16 VITALS — BP 132/78 | HR 73 | Temp 98.9°F | Resp 17 | Ht 64.0 in | Wt 219.5 lb

## 2022-09-16 DIAGNOSIS — E785 Hyperlipidemia, unspecified: Secondary | ICD-10-CM | POA: Diagnosis not present

## 2022-09-16 DIAGNOSIS — I10 Essential (primary) hypertension: Secondary | ICD-10-CM

## 2022-09-16 DIAGNOSIS — E119 Type 2 diabetes mellitus without complications: Secondary | ICD-10-CM | POA: Diagnosis not present

## 2022-09-16 DIAGNOSIS — Z23 Encounter for immunization: Secondary | ICD-10-CM | POA: Diagnosis not present

## 2022-09-16 LAB — HEPATIC FUNCTION PANEL
ALT: 17 U/L (ref 0–35)
AST: 20 U/L (ref 0–37)
Albumin: 4.1 g/dL (ref 3.5–5.2)
Alkaline Phosphatase: 104 U/L (ref 39–117)
Bilirubin, Direct: 0 mg/dL (ref 0.0–0.3)
Total Bilirubin: 0.4 mg/dL (ref 0.2–1.2)
Total Protein: 8.2 g/dL (ref 6.0–8.3)

## 2022-09-16 LAB — LIPID PANEL
Cholesterol: 169 mg/dL (ref 0–200)
HDL: 49.2 mg/dL (ref >39.00)
LDL Cholesterol: 103 mg/dL — ABNORMAL HIGH (ref 0–99)
NonHDL: 119.4
Total CHOL/HDL Ratio: 3
Triglycerides: 80 mg/dL (ref 0.0–149.0)
VLDL: 16 mg/dL (ref 0.0–40.0)

## 2022-09-16 LAB — CBC WITH DIFFERENTIAL/PLATELET
Basophils Absolute: 0.1 10*3/uL (ref 0.0–0.1)
Basophils Relative: 1.2 % (ref 0.0–3.0)
Eosinophils Absolute: 0.1 10*3/uL (ref 0.0–0.7)
Eosinophils Relative: 1.6 % (ref 0.0–5.0)
HCT: 39.5 % (ref 36.0–46.0)
Hemoglobin: 12.8 g/dL (ref 12.0–15.0)
Lymphocytes Relative: 30.8 % (ref 12.0–46.0)
Lymphs Abs: 1.8 10*3/uL (ref 0.7–4.0)
MCHC: 32.6 g/dL (ref 30.0–36.0)
MCV: 84.1 fl (ref 78.0–100.0)
Monocytes Absolute: 0.6 10*3/uL (ref 0.1–1.0)
Monocytes Relative: 10 % (ref 3.0–12.0)
Neutro Abs: 3.3 10*3/uL (ref 1.4–7.7)
Neutrophils Relative %: 56.4 % (ref 43.0–77.0)
Platelets: 185 10*3/uL (ref 150.0–400.0)
RBC: 4.69 Mil/uL (ref 3.87–5.11)
RDW: 14.3 % (ref 11.5–15.5)
WBC: 5.9 10*3/uL (ref 4.0–10.5)

## 2022-09-16 LAB — BASIC METABOLIC PANEL
BUN: 17 mg/dL (ref 6–23)
CO2: 30 mEq/L (ref 19–32)
Calcium: 9.7 mg/dL (ref 8.4–10.5)
Chloride: 100 mEq/L (ref 96–112)
Creatinine, Ser: 1.17 mg/dL (ref 0.40–1.20)
GFR: 51.72 mL/min — ABNORMAL LOW (ref >60.00)
Glucose, Bld: 132 mg/dL — ABNORMAL HIGH (ref 70–99)
Potassium: 3.7 mEq/L (ref 3.5–5.1)
Sodium: 139 mEq/L (ref 135–145)

## 2022-09-16 LAB — MICROALBUMIN / CREATININE URINE RATIO
Creatinine,U: 76.3 mg/dL
Microalb Creat Ratio: 0.9 mg/g (ref 0.0–30.0)
Microalb, Ur: <0.7 mg/dL (ref 0.0–1.9)

## 2022-09-16 LAB — HEMOGLOBIN A1C: Hgb A1c MFr Bld: 7.3 % — ABNORMAL HIGH (ref 4.6–6.5)

## 2022-09-16 LAB — TSH: TSH: 1.56 u[IU]/mL (ref 0.35–5.50)

## 2022-09-16 NOTE — Assessment & Plan Note (Signed)
Chronic problem.  On Jardiance 10mg  daily.  Currently asymptomatic.  UTD on eye exam, foot exam.  Microalbumin ordered.  Check labs.  Adjust meds prn

## 2022-09-16 NOTE — Assessment & Plan Note (Signed)
Chronic problem.  Adequate control today and asymptomatic on HCTZ 12.5mg  daily.  Check labs due to diuretic but no anticipated med changes.

## 2022-09-16 NOTE — Progress Notes (Signed)
   Subjective:    Patient ID: Nancy Spears, female    DOB: [birth year], 56 y.o.   MRN: [id number]  HPI DM- chronic problem, on Jardiance 10mg  daily.  UTD on eye exam, foot exam.  Due for microalbumin.  Denies symptomatic lows.  No numbness/tingling of hands/feet.    HTN- chronic problem, on HCTZ 12.5mg  daily w/ adequate control.  No CP, SOB, HAs, visual changes, edema.  Hyperlipidemia- chronic problem, on Lipitor 20mg  daily.  No abd pain, N/V.  Obesity- pt is down 10 lbs since last visit.  Pt has been walking regularly.  Pt reports cutting back on portion sizes and amount of food.     Review of Systems For ROS see HPI     Objective:   Physical Exam Vitals reviewed.  Constitutional:      General: She is not in acute distress.    Appearance: Normal appearance. She is well-developed. She is obese. She is not ill-appearing.  HENT:     Head: Normocephalic and atraumatic.  Eyes:     Conjunctiva/sclera: Conjunctivae normal.     Pupils: Pupils are equal, round, and reactive to light.  Neck:     Thyroid: No thyromegaly.  Cardiovascular:     Rate and Rhythm: Normal rate and regular rhythm.     Pulses: Normal pulses.     Heart sounds: Normal heart sounds. No murmur heard. Pulmonary:     Effort: Pulmonary effort is normal. No respiratory distress.     Breath sounds: Normal breath sounds.  Abdominal:     General: There is no distension.     Palpations: Abdomen is soft.     Tenderness: There is no abdominal tenderness.  Musculoskeletal:     Cervical back: Normal range of motion and neck supple.     Right lower leg: No edema.     Left lower leg: No edema.  Lymphadenopathy:     Cervical: No cervical adenopathy.  Skin:    General: Skin is warm and dry.  Neurological:     General: No focal deficit present.     Mental Status: She is alert and oriented to person, place, and time.  Psychiatric:        Mood and Affect: Mood normal.        Behavior: Behavior normal.        Thought  Content: Thought content normal.           Assessment & Plan:

## 2022-09-16 NOTE — Assessment & Plan Note (Signed)
Chronic problem.  On Lipitor 20mg daily w/o difficulty.  Check labs.  Adjust meds prn  

## 2022-09-16 NOTE — Assessment & Plan Note (Signed)
Pt is down 10 lbs since last visit!  She is now walking regularly and paying better attention to diet.  Applauded her efforts and encouraged her to continue.  Will follow.

## 2022-09-16 NOTE — Patient Instructions (Signed)
Schedule your complete physical in 3-4 months We'll notify you of your lab results and make any changes if needed Continue to work on healthy diet and regular exercise- you're doing great!!! Call with any questions or concerns Stay Safe!!  Stay Healthy!! Happy Fall!!!

## 2022-09-17 NOTE — Progress Notes (Signed)
Informed pt of lab results  

## 2022-09-20 LAB — HM PAP SMEAR

## 2022-10-11 ENCOUNTER — Other Ambulatory Visit: Payer: Self-pay | Admitting: Family Medicine

## 2022-10-11 DIAGNOSIS — E785 Hyperlipidemia, unspecified: Secondary | ICD-10-CM

## 2022-10-22 ENCOUNTER — Encounter: Payer: Self-pay | Admitting: Family Medicine

## 2022-10-23 ENCOUNTER — Other Ambulatory Visit: Payer: Self-pay | Admitting: Family Medicine

## 2022-10-23 DIAGNOSIS — Z1231 Encounter for screening mammogram for malignant neoplasm of breast: Secondary | ICD-10-CM

## 2022-10-24 LAB — HM DIABETES EYE EXAM

## 2022-11-24 ENCOUNTER — Encounter: Payer: Self-pay | Admitting: Family Medicine

## 2022-11-25 ENCOUNTER — Other Ambulatory Visit: Payer: Self-pay

## 2022-11-25 MED ORDER — EMPAGLIFLOZIN 10 MG PO TABS: 10.0000 mg | ORAL_TABLET | Freq: Every day | ORAL | 3 refills | Status: DC

## 2022-12-19 ENCOUNTER — Encounter: Payer: BC Managed Care – PPO | Admitting: Family Medicine

## 2022-12-25 ENCOUNTER — Ambulatory Visit
Admission: RE | Admit: 2022-12-25 | Discharge: 2022-12-25 | Disposition: A | Discharge status: Home / Self Care | Payer: BC Managed Care – PPO | Source: Ambulatory Visit | Attending: Family Medicine | Admitting: Family Medicine

## 2022-12-25 DIAGNOSIS — Z1231 Encounter for screening mammogram for malignant neoplasm of breast: Secondary | ICD-10-CM

## 2022-12-31 ENCOUNTER — Ambulatory Visit (INDEPENDENT_AMBULATORY_CARE_PROVIDER_SITE_OTHER): Payer: BC Managed Care – PPO | Admitting: Family Medicine

## 2022-12-31 ENCOUNTER — Encounter: Payer: Self-pay | Admitting: Family Medicine

## 2022-12-31 VITALS — BP 128/70 | HR 64 | Temp 97.7°F | Resp 18 | Ht 64.0 in | Wt 220.4 lb

## 2022-12-31 DIAGNOSIS — E119 Type 2 diabetes mellitus without complications: Secondary | ICD-10-CM | POA: Diagnosis not present

## 2022-12-31 DIAGNOSIS — Z0000 Encounter for general adult medical examination without abnormal findings: Secondary | ICD-10-CM

## 2022-12-31 DIAGNOSIS — E559 Vitamin D deficiency, unspecified: Secondary | ICD-10-CM

## 2022-12-31 LAB — CBC WITH DIFFERENTIAL/PLATELET
Basophils Absolute: 0 10*3/uL (ref 0.0–0.1)
Basophils Relative: 0.5 % (ref 0.0–3.0)
Eosinophils Absolute: 0.1 10*3/uL (ref 0.0–0.7)
Eosinophils Relative: 1.9 % (ref 0.0–5.0)
HCT: 39.6 % (ref 36.0–46.0)
Hemoglobin: 12.8 g/dL (ref 12.0–15.0)
Lymphocytes Relative: 30.1 % (ref 12.0–46.0)
Lymphs Abs: 2.1 10*3/uL (ref 0.7–4.0)
MCHC: 32.4 g/dL (ref 30.0–36.0)
MCV: 83.7 fl (ref 78.0–100.0)
Monocytes Absolute: 0.6 10*3/uL (ref 0.1–1.0)
Monocytes Relative: 8.9 % (ref 3.0–12.0)
Neutro Abs: 4 10*3/uL (ref 1.4–7.7)
Neutrophils Relative %: 58.6 % (ref 43.0–77.0)
Platelets: 188 10*3/uL (ref 150.0–400.0)
RBC: 4.72 Mil/uL (ref 3.87–5.11)
RDW: 14.4 % (ref 11.5–15.5)
WBC: 6.8 10*3/uL (ref 4.0–10.5)

## 2022-12-31 LAB — BASIC METABOLIC PANEL
BUN: 18 mg/dL (ref 6–23)
CO2: 29 mEq/L (ref 19–32)
Calcium: 9.8 mg/dL (ref 8.4–10.5)
Chloride: 99 mEq/L (ref 96–112)
Creatinine, Ser: 1.14 mg/dL (ref 0.40–1.20)
GFR: 53.25 mL/min — ABNORMAL LOW (ref >60.00)
Glucose, Bld: 123 mg/dL — ABNORMAL HIGH (ref 70–99)
Potassium: 3.6 mEq/L (ref 3.5–5.1)
Sodium: 137 mEq/L (ref 135–145)

## 2022-12-31 LAB — HEPATIC FUNCTION PANEL
ALT: 18 U/L (ref 0–35)
AST: 21 U/L (ref 0–37)
Albumin: 4.1 g/dL (ref 3.5–5.2)
Alkaline Phosphatase: 96 U/L (ref 39–117)
Bilirubin, Direct: 0.1 mg/dL (ref 0.0–0.3)
Total Bilirubin: 0.5 mg/dL (ref 0.2–1.2)
Total Protein: 7.8 g/dL (ref 6.0–8.3)

## 2022-12-31 LAB — LIPID PANEL
Cholesterol: 171 mg/dL (ref 0–200)
HDL: 49.7 mg/dL (ref >39.00)
LDL Cholesterol: 104 mg/dL — ABNORMAL HIGH (ref 0–99)
NonHDL: 121.42
Total CHOL/HDL Ratio: 3
Triglycerides: 87 mg/dL (ref 0.0–149.0)
VLDL: 17.4 mg/dL (ref 0.0–40.0)

## 2022-12-31 LAB — HEMOGLOBIN A1C: Hgb A1c MFr Bld: 7.1 % — ABNORMAL HIGH (ref 4.6–6.5)

## 2022-12-31 LAB — VITAMIN D 25 HYDROXY (VIT D DEFICIENCY, FRACTURES): VITD: 30.22 ng/mL (ref 30.00–100.00)

## 2022-12-31 LAB — TSH: TSH: 1.85 u[IU]/mL (ref 0.35–5.50)

## 2022-12-31 NOTE — Assessment & Plan Note (Signed)
Pt's BMI of 37.83 qualifies as morbidly obese due to HTN and DM.  Encouraged low carb diet and regular exercise.  Check labs.  Will follow.

## 2022-12-31 NOTE — Patient Instructions (Signed)
Follow up in 3-4 months to recheck sugar We'll notify you of your lab results and make any changes if needed Continue to work on healthy diet and regular exercise- you can do it!!! Call with any questions or concerns Stay Safe!  Stay Healthy! Happy New Year!!!

## 2022-12-31 NOTE — Assessment & Plan Note (Signed)
Chronic problem.  Hx of good control.  UTD on foot exam, eye exam, microalbumin.  Check labs.  Adjust meds prn

## 2022-12-31 NOTE — Assessment & Plan Note (Signed)
Check labs and replete prn. 

## 2022-12-31 NOTE — Progress Notes (Signed)
   Subjective:    Patient ID: Nancy Spears, female    DOB: [birth year], 57 y.o.   MRN: [id number]  HPI CPE- UTD on foot exam, eye exam, microalbumin, mammo, colonoscopy, pap  Patient Care Team    Relationship Specialty Notifications Start End  Midge Minium, MD PCP - General   11/28/10   Ena Dawley, MD Consulting Physician Obstetrics and Gynecology  02/12/16   Webb Laws, [place] Referring Physician Optometry  02/12/16   Dorann Ou, MD Consulting Physician Ophthalmology  02/12/16     Health Maintenance  Topic Date Due   Zoster Vaccines- Shingrix (1 of 2) 04/01/2023 (Originally 12/24/2014)   FOOT EXAM  02/15/2023   HEMOGLOBIN A1C  03/18/2023   Diabetic kidney evaluation - eGFR measurement  09/17/2023   Diabetic kidney evaluation - Urine ACR  09/17/2023   OPHTHALMOLOGY EXAM  09/25/2023   DTaP/Tdap/Td (2 - Td or Tdap) 01/15/2024   MAMMOGRAM  12/25/2024   COLONOSCOPY (Pts 45-52yrs Insurance coverage will need to be confirmed)  03/12/2025   PAP SMEAR-Modifier  09/20/2025   INFLUENZA VACCINE  Completed   HPV VACCINES  Aged Out   COVID-19 Vaccine  Discontinued   Hepatitis C Screening  Discontinued   HIV Screening  Discontinued      Review of Systems Patient reports no vision/ hearing changes, adenopathy,fever, weight change,  persistant/recurrent hoarseness , swallowing issues, chest pain, palpitations, edema, persistant/recurrent cough, hemoptysis, dyspnea (rest/exertional/paroxysmal nocturnal), gastrointestinal bleeding (melena, rectal bleeding), abdominal pain, significant heartburn, bowel changes, GU symptoms (dysuria, hematuria, incontinence), Gyn symptoms (abnormal  bleeding, pain),  syncope, focal weakness, memory loss, numbness & tingling, skin/hair/nail changes, abnormal bruising or bleeding, anxiety, or depression.     Objective:   Physical Exam General Appearance:    Alert, cooperative, no distress, appears stated age, obese  Head:    Normocephalic, without obvious  abnormality, atraumatic  Eyes:    PERRL, conjunctiva/corneas clear, EOM's intact both eyes  Ears:    Normal TM's and external ear canals, both ears  Nose:   Nares normal, septum midline, mucosa normal, no drainage    or sinus tenderness  Throat:   Lips, mucosa, and tongue normal; teeth and gums normal  Neck:   Supple, symmetrical, trachea midline, no adenopathy;    Thyroid: no enlargement/tenderness/nodules  Back:     Symmetric, no curvature, ROM normal, no CVA tenderness  Lungs:     Clear to auscultation bilaterally, respirations unlabored  Chest Wall:    No tenderness or deformity   Heart:    Regular rate and rhythm, S1 and S2 normal, no murmur, rub   or gallop  Breast Exam:    Deferred to GYN  Abdomen:     Soft, non-tender, bowel sounds active all four quadrants,    no masses, no organomegaly  Genitalia:    Deferred to GYN  Rectal:    Extremities:   Extremities normal, atraumatic, no cyanosis or edema  Pulses:   2+ and symmetric all extremities  Skin:   Skin color, texture, turgor normal, no rashes or lesions  Lymph nodes:   Cervical, supraclavicular, and axillary nodes normal  Neurologic:   CNII-XII intact, normal strength, sensation and reflexes    throughout          Assessment & Plan:

## 2022-12-31 NOTE — Assessment & Plan Note (Signed)
Pt's PE WNL w/ exception of BMI.  UTD on pap, mammo, colonoscopy, immunizations.  Check labs.  Anticipatory guidance provided.  

## 2023-01-07 ENCOUNTER — Encounter: Payer: Self-pay | Admitting: Family Medicine

## 2023-01-14 ENCOUNTER — Other Ambulatory Visit: Payer: Self-pay | Admitting: Obstetrics and Gynecology

## 2023-03-03 ENCOUNTER — Other Ambulatory Visit: Payer: Self-pay | Admitting: Family Medicine

## 2023-03-19 ENCOUNTER — Other Ambulatory Visit: Payer: Self-pay | Admitting: Family Medicine

## 2023-04-29 ENCOUNTER — Ambulatory Visit: Payer: BC Managed Care – PPO | Admitting: Family Medicine

## 2023-04-29 ENCOUNTER — Encounter: Payer: Self-pay | Admitting: Family Medicine

## 2023-04-29 VITALS — BP 124/80 | HR 70 | Temp 97.9°F | Resp 17 | Ht 64.0 in | Wt 219.1 lb

## 2023-04-29 DIAGNOSIS — E785 Hyperlipidemia, unspecified: Secondary | ICD-10-CM

## 2023-04-29 DIAGNOSIS — E119 Type 2 diabetes mellitus without complications: Secondary | ICD-10-CM | POA: Diagnosis not present

## 2023-04-29 DIAGNOSIS — Z7984 Long term (current) use of oral hypoglycemic drugs: Secondary | ICD-10-CM

## 2023-04-29 LAB — BASIC METABOLIC PANEL
BUN: 17 mg/dL (ref 6–23)
CO2: 32 mEq/L (ref 19–32)
Calcium: 9.9 mg/dL (ref 8.4–10.5)
Chloride: 98 mEq/L (ref 96–112)
Creatinine, Ser: 1.07 mg/dL (ref 0.40–1.20)
GFR: 57.32 mL/min — ABNORMAL LOW (ref >60.00)
Glucose, Bld: 122 mg/dL — ABNORMAL HIGH (ref 70–99)
Potassium: 3.8 mEq/L (ref 3.5–5.1)
Sodium: 139 mEq/L (ref 135–145)

## 2023-04-29 LAB — HEMOGLOBIN A1C: Hgb A1c MFr Bld: 7.1 % — ABNORMAL HIGH (ref 4.6–6.5)

## 2023-04-29 MED ORDER — ATORVASTATIN CALCIUM 20 MG PO TABS: ORAL_TABLET | ORAL | 1 refills | Status: DC

## 2023-04-29 NOTE — Patient Instructions (Signed)
Follow up in 3-4 months to recheck sugar, blood pressure, and cholesterol We'll notify you of your lab results and make any changes if needed Continue to work on healthy diet and regular exercise- you can do it! Call with any questions or concerns Stay Safe!  Stay Healthy! Have a great summer!!!

## 2023-04-29 NOTE — Assessment & Plan Note (Signed)
Chronic problem.  On Jardiance 10mg  daily.  Last A1C 7.1%.  UTD on eye exam, microalbumin.  Foot exam done today.  Currently asymptomatic.  Check labs.  Adjust meds prn

## 2023-04-29 NOTE — Progress Notes (Signed)
   Subjective:    Patient ID: Nancy Spears, female    DOB: 1966/08/24, 57 y.o.   MRN: [id number]  HPI DM- chronic problem.  Now on Jardiance 10mg  daily.  Last A1C 7.1%  UTD on eye exam, microalbumin.  Due for foot exam.  Pt reports feeling well.  No CP, SOB, HA's, visual changes, abd pain, N/V.  No numbness/tingling of hands/feet.  Denies symptomatic lows.   Review of Systems For ROS see HPI     Objective:   Physical Exam Vitals reviewed.  Constitutional:      General: She is not in acute distress.    Appearance: Normal appearance. She is well-developed. She is obese.  HENT:     Head: Normocephalic and atraumatic.  Eyes:     Conjunctiva/sclera: Conjunctivae normal.     Pupils: Pupils are equal, round, and reactive to light.  Neck:     Thyroid: No thyromegaly.  Cardiovascular:     Rate and Rhythm: Normal rate and regular rhythm.     Pulses: Normal pulses.     Heart sounds: Normal heart sounds. No murmur heard. Pulmonary:     Effort: Pulmonary effort is normal. No respiratory distress.     Breath sounds: Normal breath sounds.  Abdominal:     General: There is no distension.     Palpations: Abdomen is soft.     Tenderness: There is no abdominal tenderness.  Musculoskeletal:     Cervical back: Normal range of motion and neck supple.     Right lower leg: No edema.     Left lower leg: No edema.  Lymphadenopathy:     Cervical: No cervical adenopathy.  Skin:    General: Skin is warm and dry.  Neurological:     General: No focal deficit present.     Mental Status: She is alert and oriented to person, place, and time.  Psychiatric:        Mood and Affect: Mood normal.        Behavior: Behavior normal.           Assessment & Plan:

## 2023-04-30 ENCOUNTER — Telehealth: Payer: Self-pay

## 2023-04-30 NOTE — Telephone Encounter (Signed)
Pt aware of lab results 

## 2023-04-30 NOTE — Telephone Encounter (Signed)
-----   Message from Sheliah Hatch, MD sent at 04/30/2023  7:29 AM EDT ----- Labs look great!  No changes at this time

## 2023-07-16 ENCOUNTER — Encounter (INDEPENDENT_AMBULATORY_CARE_PROVIDER_SITE_OTHER): Payer: Self-pay

## 2023-07-28 ENCOUNTER — Other Ambulatory Visit: Payer: Self-pay | Admitting: Family Medicine

## 2023-07-30 ENCOUNTER — Encounter: Payer: Self-pay | Admitting: Family Medicine

## 2023-07-30 ENCOUNTER — Ambulatory Visit: Payer: BC Managed Care – PPO | Admitting: Family Medicine

## 2023-07-30 VITALS — BP 132/84 | HR 65 | Temp 97.8°F | Resp 19 | Ht 64.0 in | Wt 221.0 lb

## 2023-07-30 DIAGNOSIS — E119 Type 2 diabetes mellitus without complications: Secondary | ICD-10-CM

## 2023-07-30 DIAGNOSIS — I10 Essential (primary) hypertension: Secondary | ICD-10-CM

## 2023-07-30 DIAGNOSIS — E785 Hyperlipidemia, unspecified: Secondary | ICD-10-CM | POA: Diagnosis not present

## 2023-07-30 LAB — TSH: TSH: 1.76 u[IU]/mL (ref 0.35–5.50)

## 2023-07-30 LAB — BASIC METABOLIC PANEL
BUN: 17 mg/dL (ref 6–23)
CO2: 31 mEq/L (ref 19–32)
Calcium: 9.9 mg/dL (ref 8.4–10.5)
Chloride: 97 mEq/L (ref 96–112)
Creatinine, Ser: 1.11 mg/dL (ref 0.40–1.20)
GFR: 54.76 mL/min — ABNORMAL LOW (ref >60.00)
Glucose, Bld: 130 mg/dL — ABNORMAL HIGH (ref 70–99)
Potassium: 3.7 mEq/L (ref 3.5–5.1)
Sodium: 136 mEq/L (ref 135–145)

## 2023-07-30 LAB — LIPID PANEL
Cholesterol: 172 mg/dL (ref 0–200)
HDL: 50.3 mg/dL (ref >39.00)
LDL Cholesterol: 105 mg/dL — ABNORMAL HIGH (ref 0–99)
NonHDL: 121.38
Total CHOL/HDL Ratio: 3
Triglycerides: 81 mg/dL (ref 0.0–149.0)
VLDL: 16.2 mg/dL (ref 0.0–40.0)

## 2023-07-30 LAB — CBC WITH DIFFERENTIAL/PLATELET
Basophils Absolute: 0 10*3/uL (ref 0.0–0.1)
Basophils Relative: 0.9 % (ref 0.0–3.0)
Eosinophils Absolute: 0.1 10*3/uL (ref 0.0–0.7)
Eosinophils Relative: 1.4 % (ref 0.0–5.0)
HCT: 41.7 % (ref 36.0–46.0)
Hemoglobin: 13.1 g/dL (ref 12.0–15.0)
Lymphocytes Relative: 33.8 % (ref 12.0–46.0)
Lymphs Abs: 1.9 10*3/uL (ref 0.7–4.0)
MCHC: 31.5 g/dL (ref 30.0–36.0)
MCV: 85.1 fl (ref 78.0–100.0)
Monocytes Absolute: 0.6 10*3/uL (ref 0.1–1.0)
Monocytes Relative: 9.9 % (ref 3.0–12.0)
Neutro Abs: 3.1 10*3/uL (ref 1.4–7.7)
Neutrophils Relative %: 54 % (ref 43.0–77.0)
Platelets: 207 10*3/uL (ref 150.0–400.0)
RBC: 4.9 Mil/uL (ref 3.87–5.11)
RDW: 14.2 % (ref 11.5–15.5)
WBC: 5.8 10*3/uL (ref 4.0–10.5)

## 2023-07-30 LAB — HEMOGLOBIN A1C: Hgb A1c MFr Bld: 7.2 % — ABNORMAL HIGH (ref 4.6–6.5)

## 2023-07-30 LAB — HEPATIC FUNCTION PANEL
ALT: 18 U/L (ref 0–35)
AST: 22 U/L (ref 0–37)
Albumin: 4.3 g/dL (ref 3.5–5.2)
Alkaline Phosphatase: 109 U/L (ref 39–117)
Bilirubin, Direct: 0 mg/dL (ref 0.0–0.3)
Total Bilirubin: 0.4 mg/dL (ref 0.2–1.2)
Total Protein: 8.4 g/dL — ABNORMAL HIGH (ref 6.0–8.3)

## 2023-07-30 NOTE — Progress Notes (Signed)
   Subjective:    Patient ID: Nancy Spears, female    DOB: April 06, 1966, 57 y.o.   MRN: [id number]  HPI DM- chronic problem, on Jardiance 10mg  daily.  UTD on foot exam, eye exam, microalbumin.  Denies symptomatic lows.  No numbness/tingling of hands/feet.  Last A1C 7.1%  Hyperlipidemia- chronic problem, on Lipitor 20mg  daily.  No abd pain, N/V  HTN- chronic problem, on hydrochlorothiazide 12.5mg  daily.  No CP, SOB, HA's, visual changes, edema.   Review of Systems For ROS see HPI     Objective:   Physical Exam Vitals reviewed.  Constitutional:      General: She is not in acute distress.    Appearance: Normal appearance. She is well-developed. She is obese. She is not ill-appearing.  HENT:     Head: Normocephalic and atraumatic.  Eyes:     Conjunctiva/sclera: Conjunctivae normal.     Pupils: Pupils are equal, round, and reactive to light.  Neck:     Thyroid: No thyromegaly.  Cardiovascular:     Rate and Rhythm: Normal rate and regular rhythm.     Pulses: Normal pulses.     Heart sounds: Normal heart sounds. No murmur heard. Pulmonary:     Effort: Pulmonary effort is normal. No respiratory distress.     Breath sounds: Normal breath sounds.  Abdominal:     General: There is no distension.     Palpations: Abdomen is soft.     Tenderness: There is no abdominal tenderness.  Musculoskeletal:     Cervical back: Normal range of motion and neck supple.     Right lower leg: No edema.     Left lower leg: No edema.  Lymphadenopathy:     Cervical: No cervical adenopathy.  Skin:    General: Skin is warm and dry.  Neurological:     General: No focal deficit present.     Mental Status: She is alert and oriented to person, place, and time.  Psychiatric:        Mood and Affect: Mood normal.        Behavior: Behavior normal.        Thought Content: Thought content normal.           Assessment & Plan:

## 2023-07-30 NOTE — Patient Instructions (Signed)
Follow up in 3-4 months to recheck sugars We'll notify you of your lab results and make any changes if needed Continue to work on healthy diet and regular exercise- you can do it!! Call with any questions or concerns Stay Safe!  Stay Healthy! Enjoy the rest of your summer!!!

## 2023-07-30 NOTE — Assessment & Plan Note (Signed)
Chronic problem.  On Lipitor 20mg daily w/o difficulty.  Check labs.  Adjust meds prn  

## 2023-07-30 NOTE — Assessment & Plan Note (Signed)
Chronic problem.  Currently on hydrochlorothiazide 12.5mg  daily w/ adequate control.  Asymptomatic.  Check labs due to diuretic but no anticipated med changes.

## 2023-07-30 NOTE — Addendum Note (Signed)
Addended by: Sheliah Hatch on: 07/30/2023 08:55 AM   Modules accepted: Orders

## 2023-07-30 NOTE — Assessment & Plan Note (Signed)
Chronic problem for pt.  Currently tolerating Jardiance 10mg  daily w/o difficulty.  UTD on foot exam, eye exam, microalbumin.  Check labs.  Adjust meds prn

## 2023-07-31 ENCOUNTER — Telehealth: Payer: Self-pay

## 2023-07-31 ENCOUNTER — Other Ambulatory Visit: Payer: Self-pay | Admitting: Family Medicine

## 2023-07-31 NOTE — Telephone Encounter (Signed)
-----   Message from Neena Rhymes sent at 07/31/2023  7:32 AM EDT ----- Labs are stable.  A1C will improve w/ low carb/low sugar diet and regular physical activity.  No med changes at this time

## 2023-10-30 ENCOUNTER — Encounter: Payer: Self-pay | Admitting: Family Medicine

## 2023-10-30 ENCOUNTER — Ambulatory Visit: Payer: BC Managed Care – PPO | Admitting: Family Medicine

## 2023-10-30 VITALS — BP 112/64 | HR 64 | Temp 97.7°F | Ht 64.0 in | Wt 221.0 lb

## 2023-10-30 DIAGNOSIS — E119 Type 2 diabetes mellitus without complications: Secondary | ICD-10-CM | POA: Diagnosis not present

## 2023-10-30 DIAGNOSIS — Z7984 Long term (current) use of oral hypoglycemic drugs: Secondary | ICD-10-CM

## 2023-10-30 DIAGNOSIS — H6991 Unspecified Eustachian tube disorder, right ear: Secondary | ICD-10-CM | POA: Diagnosis not present

## 2023-10-30 DIAGNOSIS — R0981 Nasal congestion: Secondary | ICD-10-CM | POA: Diagnosis not present

## 2023-10-30 DIAGNOSIS — Z23 Encounter for immunization: Secondary | ICD-10-CM | POA: Diagnosis not present

## 2023-10-30 LAB — BASIC METABOLIC PANEL
BUN: 14 mg/dL (ref 6–23)
CO2: 32 meq/L (ref 19–32)
Calcium: 9.6 mg/dL (ref 8.4–10.5)
Chloride: 100 meq/L (ref 96–112)
Creatinine, Ser: 0.99 mg/dL (ref 0.40–1.20)
GFR: 62.7 mL/min (ref >60.00)
Glucose, Bld: 151 mg/dL — ABNORMAL HIGH (ref 70–99)
Potassium: 3.9 meq/L (ref 3.5–5.1)
Sodium: 138 meq/L (ref 135–145)

## 2023-10-30 LAB — MICROALBUMIN / CREATININE URINE RATIO
Creatinine,U: 81.4 mg/dL
Microalb Creat Ratio: 0.9 mg/g (ref 0.0–30.0)
Microalb, Ur: <0.7 mg/dL (ref 0.0–1.9)

## 2023-10-30 LAB — POC COVID19 BINAXNOW: SARS Coronavirus 2 Ag: NEGATIVE

## 2023-10-30 LAB — HEMOGLOBIN A1C: Hgb A1c MFr Bld: 7.3 % — ABNORMAL HIGH (ref 4.6–6.5)

## 2023-10-30 LAB — HM DIABETES EYE EXAM

## 2023-10-30 NOTE — Assessment & Plan Note (Signed)
Chronic problem for pt.  Currently on Jardiance 10mg  daily.  UTD on foot exam, eye exam.  Microalbumin today.  Asymptomatic.  Check labs.  Adjust meds prn

## 2023-10-30 NOTE — Patient Instructions (Signed)
Schedule your complete physical in 6 months We'll notify you of your lab results and make any changes if needed Keep up the good work on healthy diet and regular exercise- you look great! Call with any questions or concerns Stay Safe!  Stay Healthy! ENJOY YOUR VACATION!!!!

## 2023-10-30 NOTE — Progress Notes (Signed)
   Subjective:    Patient ID: Nancy Spears, female    DOB: [birth year], 57 y.o.   MRN: [id number]  HPI DM- chronic problem, on Jardiance 10mg  daily.  Last A1C 7.2%.  UTD on foot exam.  Due for microalbumin.  Eye exam done- will get records.  No CP, SOB, HA's, visual changes.  No abd pain, N/V.  No numbness/tingling of hands/feet.  R ear fullness- new.  Started on her recent trip to [place].  Mild nasal congestion.  Denies HA.  No fever.  No body aches/chills.     Review of Systems For ROS see HPI     Objective:   Physical Exam Vitals reviewed.  Constitutional:      General: She is not in acute distress.    Appearance: Normal appearance. She is well-developed. She is not ill-appearing.  HENT:     Head: Normocephalic and atraumatic.  Eyes:     Conjunctiva/sclera: Conjunctivae normal.     Pupils: Pupils are equal, round, and reactive to light.  Neck:     Thyroid: No thyromegaly.  Cardiovascular:     Rate and Rhythm: Normal rate and regular rhythm.     Heart sounds: Normal heart sounds. No murmur heard. Pulmonary:     Effort: Pulmonary effort is normal. No respiratory distress.     Breath sounds: Normal breath sounds.  Abdominal:     General: There is no distension.     Palpations: Abdomen is soft.     Tenderness: There is no abdominal tenderness.  Musculoskeletal:     Cervical back: Normal range of motion and neck supple.  Lymphadenopathy:     Cervical: No cervical adenopathy.  Skin:    General: Skin is warm and dry.  Neurological:     Mental Status: She is alert and oriented to person, place, and time.  Psychiatric:        Behavior: Behavior normal.           Assessment & Plan:  R eustachian tube dysfxn- new.  No evidence of infxn.  Mild nasal congestion.  Suspect allergy mediated.  Encouraged continued use of daily antihistamine.  Pt expressed understanding and is in agreement w/ plan.

## 2023-11-03 ENCOUNTER — Other Ambulatory Visit: Payer: Self-pay | Admitting: Family Medicine

## 2023-11-03 DIAGNOSIS — E785 Hyperlipidemia, unspecified: Secondary | ICD-10-CM

## 2023-11-24 ENCOUNTER — Other Ambulatory Visit: Payer: Self-pay | Admitting: Family Medicine

## 2023-12-12 ENCOUNTER — Other Ambulatory Visit: Payer: Self-pay | Admitting: Family Medicine

## 2023-12-12 DIAGNOSIS — Z1231 Encounter for screening mammogram for malignant neoplasm of breast: Secondary | ICD-10-CM

## 2023-12-31 ENCOUNTER — Ambulatory Visit
Admission: RE | Admit: 2023-12-31 | Discharge: 2023-12-31 | Disposition: A | Discharge status: Home / Self Care | Payer: 59 | Source: Ambulatory Visit | Attending: Family Medicine | Admitting: Family Medicine

## 2023-12-31 DIAGNOSIS — Z1231 Encounter for screening mammogram for malignant neoplasm of breast: Secondary | ICD-10-CM

## 2024-02-02 ENCOUNTER — Other Ambulatory Visit: Payer: Self-pay | Admitting: Family Medicine

## 2024-02-10 ENCOUNTER — Ambulatory Visit (INDEPENDENT_AMBULATORY_CARE_PROVIDER_SITE_OTHER): Payer: 59 | Admitting: Family Medicine

## 2024-02-10 ENCOUNTER — Encounter: Payer: Self-pay | Admitting: Family Medicine

## 2024-02-10 VITALS — BP 112/68 | HR 69 | Temp 97.8°F | Ht 64.0 in | Wt 219.2 lb

## 2024-02-10 DIAGNOSIS — E119 Type 2 diabetes mellitus without complications: Secondary | ICD-10-CM | POA: Diagnosis not present

## 2024-02-10 DIAGNOSIS — Z23 Encounter for immunization: Secondary | ICD-10-CM

## 2024-02-10 DIAGNOSIS — E559 Vitamin D deficiency, unspecified: Secondary | ICD-10-CM

## 2024-02-10 DIAGNOSIS — Z0000 Encounter for general adult medical examination without abnormal findings: Secondary | ICD-10-CM | POA: Diagnosis not present

## 2024-02-10 LAB — CBC WITH DIFFERENTIAL/PLATELET
Basophils Absolute: 0 10*3/uL (ref 0.0–0.1)
Basophils Relative: 0.6 % (ref 0.0–3.0)
Eosinophils Absolute: 0.1 10*3/uL (ref 0.0–0.7)
Eosinophils Relative: 1.8 % (ref 0.0–5.0)
HCT: 41.5 % (ref 36.0–46.0)
Hemoglobin: 13.4 g/dL (ref 12.0–15.0)
Lymphocytes Relative: 35.7 % (ref 12.0–46.0)
Lymphs Abs: 2 10*3/uL (ref 0.7–4.0)
MCHC: 32.3 g/dL (ref 30.0–36.0)
MCV: 85.2 fL (ref 78.0–100.0)
Monocytes Absolute: 0.5 10*3/uL (ref 0.1–1.0)
Monocytes Relative: 9.2 % (ref 3.0–12.0)
Neutro Abs: 2.9 10*3/uL (ref 1.4–7.7)
Neutrophils Relative %: 52.7 % (ref 43.0–77.0)
Platelets: 210 10*3/uL (ref 150.0–400.0)
RBC: 4.88 Mil/uL (ref 3.87–5.11)
RDW: 14.1 % (ref 11.5–15.5)
WBC: 5.5 10*3/uL (ref 4.0–10.5)

## 2024-02-10 LAB — BASIC METABOLIC PANEL
BUN: 16 mg/dL (ref 6–23)
CO2: 31 meq/L (ref 19–32)
Calcium: 9.8 mg/dL (ref 8.4–10.5)
Chloride: 98 meq/L (ref 96–112)
Creatinine, Ser: 1.12 mg/dL (ref 0.40–1.20)
GFR: 53.97 mL/min — ABNORMAL LOW (ref >60.00)
Glucose, Bld: 137 mg/dL — ABNORMAL HIGH (ref 70–99)
Potassium: 4 meq/L (ref 3.5–5.1)
Sodium: 138 meq/L (ref 135–145)

## 2024-02-10 LAB — LIPID PANEL
Cholesterol: 174 mg/dL (ref 0–200)
HDL: 57.7 mg/dL (ref >39.00)
LDL Cholesterol: 98 mg/dL (ref 0–99)
NonHDL: 116.26
Total CHOL/HDL Ratio: 3
Triglycerides: 89 mg/dL (ref 0.0–149.0)
VLDL: 17.8 mg/dL (ref 0.0–40.0)

## 2024-02-10 LAB — HEPATIC FUNCTION PANEL
ALT: 18 U/L (ref 0–35)
AST: 19 U/L (ref 0–37)
Albumin: 4.3 g/dL (ref 3.5–5.2)
Alkaline Phosphatase: 107 U/L (ref 39–117)
Bilirubin, Direct: 0.1 mg/dL (ref 0.0–0.3)
Total Bilirubin: 0.5 mg/dL (ref 0.2–1.2)
Total Protein: 8.6 g/dL — ABNORMAL HIGH (ref 6.0–8.3)

## 2024-02-10 LAB — HEMOGLOBIN A1C: Hgb A1c MFr Bld: 7.7 % — ABNORMAL HIGH (ref 4.6–6.5)

## 2024-02-10 LAB — VITAMIN D 25 HYDROXY (VIT D DEFICIENCY, FRACTURES): VITD: 29.15 ng/mL — ABNORMAL LOW (ref 30.00–100.00)

## 2024-02-10 LAB — TSH: TSH: 1.3 u[IU]/mL (ref 0.35–5.50)

## 2024-02-10 NOTE — Patient Instructions (Signed)
 Follow up in 3-4 months to recheck sugar We'll notify you of your lab results and make any changes if needed Continue to work on healthy diet and regular exercise- you can do it!!! Call with any questions or concerns Stay Safe!  Stay Healthy! Happy Spring!!!

## 2024-02-10 NOTE — Progress Notes (Signed)
   Subjective:    Patient ID: Nancy Spears, female    DOB: Sep 11, 1966, 58 y.o.   MRN: [id number]  HPI CPE- UTD on foot exam, A1C, microalbumin, colonoscopy, pap, mammo, eye exam (need records from Dr Clydene Pugh).  Due for Tdap.  Patient Care Team    Relationship Specialty Notifications Start End  Sheliah Hatch, MD PCP - General   11/28/10   Merrily Pew, MD Consulting Physician Ophthalmology  02/12/16   Center, [facility]  Chiropractic Medicine  02/10/24   Hoover Browns, MD Consulting Physician Obstetrics and Gynecology  02/10/24     Health Maintenance  Topic Date Due   Pneumococcal Vaccine 80-32 Years old (1 of 2 - PCV) Never done   Zoster Vaccines- Shingrix (1 of 2) Never done   OPHTHALMOLOGY EXAM  10/25/2023   DTaP/Tdap/Td (2 - Td or Tdap) 01/15/2024   FOOT EXAM  04/28/2024   HEMOGLOBIN A1C  04/28/2024   Diabetic kidney evaluation - eGFR measurement  10/29/2024   Diabetic kidney evaluation - Urine ACR  10/29/2024   Colonoscopy  03/12/2025   Cervical Cancer Screening (HPV/Pap Cotest)  09/20/2025   MAMMOGRAM  12/30/2025   INFLUENZA VACCINE  Completed   HPV VACCINES  Aged Out   COVID-19 Vaccine  Discontinued   Hepatitis C Screening  Discontinued   HIV Screening  Discontinued      Review of Systems Patient reports no vision/ hearing changes, adenopathy,fever, weight change,  persistant/recurrent hoarseness , swallowing issues, chest pain, palpitations, edema, persistant/recurrent cough, hemoptysis, dyspnea (rest/exertional/paroxysmal nocturnal), gastrointestinal bleeding (melena, rectal bleeding), abdominal pain, significant heartburn, bowel changes, GU symptoms (dysuria, hematuria, incontinence), Gyn symptoms (abnormal  bleeding, pain),  syncope, focal weakness, memory loss, numbness & tingling, skin/hair/nail changes, abnormal bruising or bleeding, anxiety, or depression.     Objective:   Physical Exam General Appearance:    Alert, cooperative, no distress, appears  stated age  Head:    Normocephalic, without obvious abnormality, atraumatic  Eyes:    PERRL, conjunctiva/corneas clear, EOM's intact both eyes  Ears:    Normal TM's and external ear canals, both ears  Nose:   Nares normal, septum midline, mucosa normal, no drainage    or sinus tenderness  Throat:   Lips, mucosa, and tongue normal; teeth and gums normal  Neck:   Supple, symmetrical, trachea midline, no adenopathy;    Thyroid: no enlargement/tenderness/nodules  Back:     Symmetric, no curvature, ROM normal, no CVA tenderness  Lungs:     Clear to auscultation bilaterally, respirations unlabored  Chest Wall:    No tenderness or deformity   Heart:    Regular rate and rhythm, S1 and S2 normal, no murmur, rub   or gallop  Breast Exam:    Deferred to GYN  Abdomen:     Soft, non-tender, bowel sounds active all four quadrants,    no masses, no organomegaly  Genitalia:    Deferred to GYN  Rectal:    Extremities:   Extremities normal, atraumatic, no cyanosis or edema  Pulses:   2+ and symmetric all extremities  Skin:   Skin color, texture, turgor normal, no rashes or lesions  Lymph nodes:   Cervical, supraclavicular, and axillary nodes normal  Neurologic:   CNII-XII intact, normal strength, sensation and reflexes    throughout          Assessment & Plan:

## 2024-02-10 NOTE — Assessment & Plan Note (Signed)
 Pt's PE WNL w/ exception of BMI.  UTD on pap, mammo, colonoscopy.  Got Tdap today.  Check labs.  Anticipatory guidance provided.

## 2024-02-10 NOTE — Addendum Note (Signed)
 Addended by: Ester Rink on: 02/10/2024 11:15 AM   Modules accepted: Orders

## 2024-02-11 ENCOUNTER — Encounter: Payer: Self-pay | Admitting: Family Medicine

## 2024-03-24 ENCOUNTER — Other Ambulatory Visit: Payer: Self-pay | Admitting: Family Medicine

## 2024-04-29 ENCOUNTER — Other Ambulatory Visit: Payer: Self-pay | Admitting: Family Medicine

## 2024-04-29 DIAGNOSIS — E785 Hyperlipidemia, unspecified: Secondary | ICD-10-CM

## 2024-05-11 ENCOUNTER — Encounter: Payer: Self-pay | Admitting: Family Medicine

## 2024-05-11 ENCOUNTER — Ambulatory Visit (INDEPENDENT_AMBULATORY_CARE_PROVIDER_SITE_OTHER): Payer: 59 | Admitting: Family Medicine

## 2024-05-11 VITALS — BP 128/70 | HR 55 | Temp 98.4°F | Resp 18 | Ht 64.0 in | Wt 219.8 lb

## 2024-05-11 DIAGNOSIS — Z23 Encounter for immunization: Secondary | ICD-10-CM

## 2024-05-11 DIAGNOSIS — E119 Type 2 diabetes mellitus without complications: Secondary | ICD-10-CM | POA: Diagnosis not present

## 2024-05-11 LAB — BASIC METABOLIC PANEL WITH GFR
BUN: 13 mg/dL (ref 6–23)
CO2: 29 meq/L (ref 19–32)
Calcium: 9.7 mg/dL (ref 8.4–10.5)
Chloride: 97 meq/L (ref 96–112)
Creatinine, Ser: 0.95 mg/dL (ref 0.40–1.20)
GFR: 65.64 mL/min (ref >60.00)
Glucose, Bld: 107 mg/dL — ABNORMAL HIGH (ref 70–99)
Potassium: 3.9 meq/L (ref 3.5–5.1)
Sodium: 138 meq/L (ref 135–145)

## 2024-05-11 LAB — HEMOGLOBIN A1C: Hgb A1c MFr Bld: 7.5 % — ABNORMAL HIGH (ref 4.6–6.5)

## 2024-05-11 NOTE — Progress Notes (Signed)
   Subjective:    Patient ID: Nancy Spears, female    DOB: Dec 07, 1966, 58 y.o.   MRN: [id number]  HPI DM- UTD on eye exam (will get records from [facility]).  Due for foot exam.  UTD on microalbumin.  Last A1C 7.7%.  Currently on Jardiance  10mg  daily  On statin.  Not currently on ACE.  Pt reports feeling good.  No CP, SOB, HA's, visual changes, abd pain, N/V.  No numbness/tingling of hands/feet.  Denies symptomatic lows.   Review of Systems For ROS see HPI     Objective:   Physical Exam Vitals reviewed.  Constitutional:      General: She is not in acute distress.    Appearance: Normal appearance. She is well-developed. She is obese. She is not ill-appearing.  HENT:     Head: Normocephalic and atraumatic.  Eyes:     Conjunctiva/sclera: Conjunctivae normal.     Pupils: Pupils are equal, round, and reactive to light.  Neck:     Thyroid : No thyromegaly.  Cardiovascular:     Rate and Rhythm: Normal rate and regular rhythm.     Pulses: Normal pulses.     Heart sounds: Normal heart sounds. No murmur heard. Pulmonary:     Effort: Pulmonary effort is normal. No respiratory distress.     Breath sounds: Normal breath sounds.  Abdominal:     General: There is no distension.     Palpations: Abdomen is soft.     Tenderness: There is no abdominal tenderness.  Musculoskeletal:     Cervical back: Normal range of motion and neck supple.     Right lower leg: No edema.     Left lower leg: No edema.  Lymphadenopathy:     Cervical: No cervical adenopathy.  Skin:    General: Skin is warm and dry.  Neurological:     General: No focal deficit present.     Mental Status: She is alert and oriented to person, place, and time.  Psychiatric:        Mood and Affect: Mood normal.        Behavior: Behavior normal.        Thought Content: Thought content normal.           Assessment & Plan:

## 2024-05-11 NOTE — Addendum Note (Signed)
 Addended by: Hagan Maltz K on: 05/11/2024 10:56 AM   Modules accepted: Orders

## 2024-05-11 NOTE — Patient Instructions (Signed)
 Follow up in 3-4 months to recheck sugar, cholesterol We'll notify you of your lab results and make any changes if needed Keep up the good work on healthy diet and regular exercise- you're doing great! Call with any questions or concerns Stay Safe!  Stay Healthy! Have a great summer!!!

## 2024-05-11 NOTE — Assessment & Plan Note (Signed)
 Chronic problem.  On Jardiance  10mg  daily w/o difficulty.  UTD on microalbumin.  Foot exam done.  Will get eye exam report.  Check labs.  Adjust meds prn

## 2024-05-12 ENCOUNTER — Ambulatory Visit: Payer: Self-pay | Admitting: Family Medicine

## 2024-07-27 ENCOUNTER — Other Ambulatory Visit: Payer: Self-pay | Admitting: Family Medicine

## 2024-08-02 ENCOUNTER — Other Ambulatory Visit: Payer: Self-pay | Admitting: Family Medicine

## 2024-08-11 ENCOUNTER — Encounter: Payer: Self-pay | Admitting: Family Medicine

## 2024-08-11 ENCOUNTER — Ambulatory Visit: Admitting: Family Medicine

## 2024-08-11 VITALS — BP 110/70 | HR 79 | Temp 98.7°F | Ht 64.0 in | Wt 217.4 lb

## 2024-08-11 DIAGNOSIS — Z23 Encounter for immunization: Secondary | ICD-10-CM

## 2024-08-11 DIAGNOSIS — E119 Type 2 diabetes mellitus without complications: Secondary | ICD-10-CM | POA: Diagnosis not present

## 2024-08-11 DIAGNOSIS — E785 Hyperlipidemia, unspecified: Secondary | ICD-10-CM | POA: Diagnosis not present

## 2024-08-11 DIAGNOSIS — Z1159 Encounter for screening for other viral diseases: Secondary | ICD-10-CM | POA: Diagnosis not present

## 2024-08-11 DIAGNOSIS — Z7984 Long term (current) use of oral hypoglycemic drugs: Secondary | ICD-10-CM

## 2024-08-11 DIAGNOSIS — I10 Essential (primary) hypertension: Secondary | ICD-10-CM | POA: Diagnosis not present

## 2024-08-11 LAB — HEPATIC FUNCTION PANEL
ALT: 16 U/L (ref 0–35)
AST: 17 U/L (ref 0–37)
Albumin: 4.1 g/dL (ref 3.5–5.2)
Alkaline Phosphatase: 90 U/L (ref 39–117)
Bilirubin, Direct: 0.1 mg/dL (ref 0.0–0.3)
Total Bilirubin: 0.3 mg/dL (ref 0.2–1.2)
Total Protein: 8.3 g/dL (ref 6.0–8.3)

## 2024-08-11 LAB — LIPID PANEL
Cholesterol: 166 mg/dL (ref 0–200)
HDL: 54.4 mg/dL (ref >39.00)
LDL Cholesterol: 98 mg/dL (ref 0–99)
NonHDL: 111.82
Total CHOL/HDL Ratio: 3
Triglycerides: 70 mg/dL (ref 0.0–149.0)
VLDL: 14 mg/dL (ref 0.0–40.0)

## 2024-08-11 LAB — BASIC METABOLIC PANEL WITH GFR
BUN: 17 mg/dL (ref 6–23)
CO2: 30 meq/L (ref 19–32)
Calcium: 9.5 mg/dL (ref 8.4–10.5)
Chloride: 100 meq/L (ref 96–112)
Creatinine, Ser: 0.98 mg/dL (ref 0.40–1.20)
GFR: 63.12 mL/min (ref >60.00)
Glucose, Bld: 96 mg/dL (ref 70–99)
Potassium: 3.9 meq/L (ref 3.5–5.1)
Sodium: 139 meq/L (ref 135–145)

## 2024-08-11 LAB — CBC WITH DIFFERENTIAL/PLATELET
Basophils Absolute: 0 K/uL (ref 0.0–0.1)
Basophils Relative: 0.5 % (ref 0.0–3.0)
Eosinophils Absolute: 0.1 K/uL (ref 0.0–0.7)
Eosinophils Relative: 2.1 % (ref 0.0–5.0)
HCT: 40.5 % (ref 36.0–46.0)
Hemoglobin: 12.9 g/dL (ref 12.0–15.0)
Lymphocytes Relative: 35.3 % (ref 12.0–46.0)
Lymphs Abs: 2.1 K/uL (ref 0.7–4.0)
MCHC: 31.9 g/dL (ref 30.0–36.0)
MCV: 85.3 fl (ref 78.0–100.0)
Monocytes Absolute: 0.6 K/uL (ref 0.1–1.0)
Monocytes Relative: 9.9 % (ref 3.0–12.0)
Neutro Abs: 3.1 K/uL (ref 1.4–7.7)
Neutrophils Relative %: 52.2 % (ref 43.0–77.0)
Platelets: 193 K/uL (ref 150.0–400.0)
RBC: 4.75 Mil/uL (ref 3.87–5.11)
RDW: 14.3 % (ref 11.5–15.5)
WBC: 5.9 K/uL (ref 4.0–10.5)

## 2024-08-11 LAB — TSH: TSH: 1.19 u[IU]/mL (ref 0.35–5.50)

## 2024-08-11 LAB — HEMOGLOBIN A1C: Hgb A1c MFr Bld: 7.8 % — ABNORMAL HIGH (ref 4.6–6.5)

## 2024-08-11 LAB — MICROALBUMIN / CREATININE URINE RATIO
Creatinine,U: 90 mg/dL
Microalb Creat Ratio: 18 mg/g (ref 0.0–30.0)
Microalb, Ur: 1.6 mg/dL (ref 0.0–1.9)

## 2024-08-11 NOTE — Assessment & Plan Note (Signed)
 Chronic problem.  On Jardiance  w/o difficulty.  UTD on eye exam, foot exam.  Microalbumin and A1C ordered.  Check labs.  Adjust meds prn

## 2024-08-11 NOTE — Assessment & Plan Note (Signed)
Chronic problem.  On Lipitor 20mg daily w/o difficulty.  Check labs.  Adjust meds prn  

## 2024-08-11 NOTE — Progress Notes (Signed)
   Subjective:    Patient ID: Nancy Spears, female    DOB: [birth year], 58 y.o.   MRN: [id number]  HPI DM- chronic problem, on Jardiance  10mg  daily.  UTD on eye exam, foot exam.  Due for microalbumin and A1C.  Denies numbness/tingling of hands/feet.  Denies symptomatic lows.    Hyperlipidemia- chronic problem, on Lipitor 20mg  daily.  No abd pain, N/V.  HTN- chronic problem, on hydrochlorothiazide  12.5mg  daily w/ good control.  Denies CP, SOB, HA's, visual changes, edema.   Review of Systems For ROS see HPI     Objective:   Physical Exam Vitals reviewed.  Constitutional:      General: She is not in acute distress.    Appearance: Normal appearance. She is well-developed. She is obese. She is not ill-appearing.  HENT:     Head: Normocephalic and atraumatic.  Eyes:     Conjunctiva/sclera: Conjunctivae normal.     Pupils: Pupils are equal, round, and reactive to light.  Neck:     Thyroid : No thyromegaly.  Cardiovascular:     Rate and Rhythm: Normal rate and regular rhythm.     Pulses: Normal pulses.     Heart sounds: Normal heart sounds. No murmur heard. Pulmonary:     Effort: Pulmonary effort is normal. No respiratory distress.     Breath sounds: Normal breath sounds.  Abdominal:     General: There is no distension.     Palpations: Abdomen is soft.     Tenderness: There is no abdominal tenderness.  Musculoskeletal:     Cervical back: Normal range of motion and neck supple.     Right lower leg: No edema.     Left lower leg: No edema.  Lymphadenopathy:     Cervical: No cervical adenopathy.  Skin:    General: Skin is warm and dry.  Neurological:     General: No focal deficit present.     Mental Status: She is alert and oriented to person, place, and time.  Psychiatric:        Mood and Affect: Mood normal.        Behavior: Behavior normal.        Thought Content: Thought content normal.           Assessment & Plan:

## 2024-08-11 NOTE — Assessment & Plan Note (Signed)
 Chronic problem.  On hydrochlorothiazide  12.5mg  daily w/ good control.  Currently asymptomatic.  Check labs due to diuretic use but no anticipated med changes.

## 2024-08-11 NOTE — Patient Instructions (Addendum)
 Follow up in 3-4 months to recheck sugar We'll notify you of your lab results and make any changes if needed Continue to work on healthy diet and regular exercise- you can do it! Call with any questions or concerns Stay Safe!  Stay Healthy! Hang in there!!!

## 2024-08-12 ENCOUNTER — Ambulatory Visit: Payer: Self-pay | Admitting: Family Medicine

## 2024-08-12 LAB — HEPATITIS C ANTIBODY: Hepatitis C Ab: NONREACTIVE

## 2024-08-12 NOTE — Progress Notes (Signed)
 Pt has been notified.

## 2024-10-28 ENCOUNTER — Other Ambulatory Visit: Payer: Self-pay | Admitting: Family Medicine

## 2024-10-28 DIAGNOSIS — E785 Hyperlipidemia, unspecified: Secondary | ICD-10-CM

## 2024-11-02 LAB — OPHTHALMOLOGY REPORT-SCANNED

## 2024-11-18 ENCOUNTER — Ambulatory Visit: Admitting: Family Medicine

## 2024-11-18 ENCOUNTER — Encounter: Payer: Self-pay | Admitting: Family Medicine

## 2024-11-18 VITALS — BP 130/74 | HR 76 | Temp 98.0°F | Ht 64.0 in | Wt 218.0 lb

## 2024-11-18 DIAGNOSIS — E119 Type 2 diabetes mellitus without complications: Secondary | ICD-10-CM

## 2024-11-18 DIAGNOSIS — Z23 Encounter for immunization: Secondary | ICD-10-CM

## 2024-11-18 NOTE — Progress Notes (Unsigned)
   Subjective:    Patient ID: Nancy Spears, female    DOB: 1966-11-27, 58 y.o.   MRN: [id number]  HPI DM- chronic problem, on Jardiance  10mg  daily.  UTD on eye exam, foot exam, microalbumin.  Denies CP, SOB, HA's, visual changes, abd pain, N/V.  No numbness/tingling of hands/feet.   Review of Systems For ROS see HPI     Objective:   Physical Exam Vitals reviewed.  Constitutional:      General: She is not in acute distress.    Appearance: Normal appearance. She is well-developed. She is obese. She is not ill-appearing.  HENT:     Head: Normocephalic and atraumatic.  Eyes:     Conjunctiva/sclera: Conjunctivae normal.     Pupils: Pupils are equal, round, and reactive to light.  Neck:     Thyroid : No thyromegaly.  Cardiovascular:     Rate and Rhythm: Normal rate and regular rhythm.     Pulses: Normal pulses.     Heart sounds: Normal heart sounds. No murmur heard. Pulmonary:     Effort: Pulmonary effort is normal. No respiratory distress.     Breath sounds: Normal breath sounds.  Abdominal:     General: There is no distension.     Palpations: Abdomen is soft.     Tenderness: There is no abdominal tenderness.  Musculoskeletal:     Cervical back: Normal range of motion and neck supple.     Right lower leg: No edema.     Left lower leg: No edema.  Lymphadenopathy:     Cervical: No cervical adenopathy.  Skin:    General: Skin is warm and dry.  Neurological:     General: No focal deficit present.     Mental Status: She is alert and oriented to person, place, and time.  Psychiatric:        Mood and Affect: Mood normal.        Behavior: Behavior normal.        Thought Content: Thought content normal.           Assessment & Plan:

## 2024-11-18 NOTE — Patient Instructions (Addendum)
 Schedule your complete physical in 3-4 months We'll notify you of your lab results and make any changes if needed Keep up the good work on healthy diet and regular exercise- you look great! Call with any questions or concerns Stay Safe!  Stay Healthy! I'm SO proud of you!!! CONGRATS on retirement and ENJOY the beach!!!

## 2024-11-21 NOTE — Assessment & Plan Note (Signed)
 Chronic problem.  On Jardiance  10mg  daily w/o difficulty.  UTD on foot exam, eye exam, microalbumin.  Currently asymptomatic.  Check labs.  Adjust meds prn

## 2024-12-06 ENCOUNTER — Other Ambulatory Visit: Payer: Self-pay | Admitting: Family Medicine

## 2024-12-21 ENCOUNTER — Other Ambulatory Visit: Payer: Self-pay | Admitting: Family Medicine

## 2024-12-21 DIAGNOSIS — Z1231 Encounter for screening mammogram for malignant neoplasm of breast: Secondary | ICD-10-CM

## 2025-01-05 ENCOUNTER — Ambulatory Visit
Admission: RE | Admit: 2025-01-05 | Discharge: 2025-01-05 | Disposition: A | Source: Ambulatory Visit | Attending: Family Medicine | Admitting: Family Medicine

## 2025-01-05 DIAGNOSIS — Z1231 Encounter for screening mammogram for malignant neoplasm of breast: Secondary | ICD-10-CM

## 2025-02-18 ENCOUNTER — Ambulatory Visit: Admitting: Family Medicine
